# Patient Record
Sex: Female | Born: 1975 | Race: White | Hispanic: No | Marital: Single | Smoking: Never smoker
Health system: Southern US, Community
[De-identification: ages and names within clinical notes are randomized; demographics above are authoritative.]

## PROBLEM LIST (undated history)

## (undated) HISTORY — PX: NO PAST SURGERIES: SHX2092

---

## 1999-04-26 ENCOUNTER — Other Ambulatory Visit: Admission: RE | Admit: 1999-04-26 | Discharge: 1999-04-26 | Payer: Self-pay | Admitting: Family Medicine

## 2000-05-01 ENCOUNTER — Other Ambulatory Visit: Admission: RE | Admit: 2000-05-01 | Discharge: 2000-05-01 | Payer: Self-pay | Admitting: Family Medicine

## 2000-06-25 ENCOUNTER — Other Ambulatory Visit: Admission: RE | Admit: 2000-06-25 | Discharge: 2000-06-25 | Payer: Self-pay | Admitting: Family Medicine

## 2001-07-23 ENCOUNTER — Other Ambulatory Visit: Admission: RE | Admit: 2001-07-23 | Discharge: 2001-07-23 | Payer: Self-pay | Admitting: Family Medicine

## 2002-09-30 ENCOUNTER — Other Ambulatory Visit: Admission: RE | Admit: 2002-09-30 | Discharge: 2002-09-30 | Payer: Self-pay | Admitting: Family Medicine

## 2003-10-27 ENCOUNTER — Other Ambulatory Visit: Admission: RE | Admit: 2003-10-27 | Discharge: 2003-10-27 | Payer: Self-pay | Admitting: Family Medicine

## 2018-01-07 ENCOUNTER — Other Ambulatory Visit: Payer: Self-pay

## 2018-01-09 LAB — CYTOLOGY - PAP: DIAGNOSIS: NEGATIVE

## 2022-01-16 ENCOUNTER — Other Ambulatory Visit (HOSPITAL_COMMUNITY): Payer: Self-pay | Admitting: Obstetrics and Gynecology

## 2022-01-16 DIAGNOSIS — Z1231 Encounter for screening mammogram for malignant neoplasm of breast: Secondary | ICD-10-CM

## 2022-02-17 ENCOUNTER — Other Ambulatory Visit: Payer: Self-pay

## 2022-02-17 ENCOUNTER — Encounter (HOSPITAL_COMMUNITY): Payer: Self-pay

## 2022-02-17 ENCOUNTER — Inpatient Hospital Stay (HOSPITAL_COMMUNITY): Payer: Self-pay | Attending: Obstetrics and Gynecology | Admitting: *Deleted

## 2022-02-17 ENCOUNTER — Ambulatory Visit (HOSPITAL_COMMUNITY): Admission: RE | Admit: 2022-02-17 | Payer: Self-pay | Source: Ambulatory Visit

## 2022-02-17 VITALS — BP 121/86 | Wt 135.7 lb

## 2022-02-17 DIAGNOSIS — Z1211 Encounter for screening for malignant neoplasm of colon: Secondary | ICD-10-CM

## 2022-02-17 DIAGNOSIS — Z01419 Encounter for gynecological examination (general) (routine) without abnormal findings: Secondary | ICD-10-CM

## 2022-02-17 DIAGNOSIS — R2231 Localized swelling, mass and lump, right upper limb: Secondary | ICD-10-CM

## 2022-02-17 NOTE — Patient Instructions (Signed)
Explained breast self awareness with Lindsay Peters. Pap smear completed today.  Let her know BCCCP will cover Pap smears and HPV typing every 5 years unless has a history of abnormal Pap smears. Referred patient to [facility] for a diagnostic mammogram. [name] will call patient to schedule. Patient aware someone will call her to schedule appointment. Let patient know will follow up with her within the next couple weeks with results of Pap smear by phone. [name] verbalized understanding. ? ?Kaeo Jacome, Kathaleen Maser, RN ?11:37 AM ? ? ? ? ?

## 2022-02-17 NOTE — Progress Notes (Addendum)
[name] is a 46 y.o. No obstetric history on file female who presents to [facility] clinic today with complaint of right axillary lump x 2-3 months.  ?  ?Pap Smear: Pap smear completed today. Last Pap smear was 01/07/2018 at the free cervical cancer screening clinic and was normal. Per patient has no history of an abnormal Pap smear. Last Pap smear result is available in Epic. ?  ?Physical exam: ?Breasts ?Left breast larger than right breast that per patient is normal for her. No skin abnormalities bilateral breasts. Razor burn observed right axilla. No nipple retraction bilateral breasts. No nipple discharge bilateral breasts. No lymphadenopathy. No lumps palpated left breast. Palpated a lump within the right axilla at 11 o'clock 12 cm from the nipple. No complaints of pain or tenderness on exam. ?  ?Pelvic/Bimanual ?Ext Genitalia ?Razor burn observed external genitalia. Patient stated she shaved last night. No swelling and no discharge observed on external genitalia.      ?  ?Vagina ?Vagina pink and normal texture. No lesions or discharge observed in vagina.      ?  ?Cervix ?Cervix is present. Cervix pink and of normal texture. No discharge observed.  ?  ?Uterus ?Uterus is present and palpable. Uterus in normal position and normal size.      ?  ?Adnexae ?Bilateral ovaries present and palpable. No tenderness on palpation.       ?  ?Rectovaginal ?No rectal exam completed today since patient had no rectal complaints. No skin abnormalities observed on exam.   ?  ?Smoking History: ?Patient has never smoked. ?  ?Patient Navigation: ?Patient education provided. Access to services provided for patient through [facility] program.  ? ?Colorectal Cancer Screening: ?Per patient has never had colonoscopy completed. FIT Test given to patient to complete. No complaints today.  ?  ?Breast and Cervical Cancer Risk Assessment: ?Patient does not have family history of breast cancer, known genetic mutations, or radiation treatment to  the chest before age 68. Patient does not have history of cervical dysplasia, immunocompromised, or DES exposure in-utero. ? ?Risk Assessment   ? ? Risk Scores   ? ?   02/17/2022  ? Last edited by: Narda Rutherford, LPN  ? 5-year risk: 0.9 %  ? Lifetime risk: 9.5 %  ? ?  ?  ? ?  ? ? ?A: ?BCCCP exam with pap smear ?Complaint of right axillary lump. ? ?P: ?Referred patient to [facility] for a diagnostic mammogram. [name] will call patient to schedule. ? ?Priscille Heidelberg, RN ?02/17/2022 11:37 AM   ?

## 2022-02-20 ENCOUNTER — Other Ambulatory Visit (HOSPITAL_COMMUNITY): Payer: Self-pay | Admitting: Obstetrics and Gynecology

## 2022-02-20 DIAGNOSIS — R2231 Localized swelling, mass and lump, right upper limb: Secondary | ICD-10-CM

## 2022-02-20 LAB — CYTOLOGY - PAP
Comment: NEGATIVE
Diagnosis: NEGATIVE
High risk HPV: NEGATIVE

## 2022-02-22 ENCOUNTER — Telehealth: Payer: Self-pay

## 2022-02-22 NOTE — Telephone Encounter (Signed)
Patient informed negative Pap/HPV results, next pap smear is due in 5 years. Patient verbalized understanding.  ?

## 2022-02-23 LAB — FECAL OCCULT BLOOD, IMMUNOCHEMICAL: Fecal Occult Bld: NEGATIVE

## 2022-02-28 ENCOUNTER — Telehealth: Payer: Self-pay

## 2022-02-28 NOTE — Telephone Encounter (Signed)
Patient informed negative FIT test results. Patient verbalized understanding.  

## 2022-03-07 ENCOUNTER — Ambulatory Visit (HOSPITAL_COMMUNITY)
Admission: RE | Admit: 2022-03-07 | Discharge: 2022-03-07 | Disposition: A | Discharge status: Home / Self Care | Payer: Self-pay | Source: Ambulatory Visit | Attending: Obstetrics and Gynecology | Admitting: Obstetrics and Gynecology

## 2022-03-07 DIAGNOSIS — R2231 Localized swelling, mass and lump, right upper limb: Secondary | ICD-10-CM

## 2023-02-05 ENCOUNTER — Other Ambulatory Visit (HOSPITAL_COMMUNITY): Payer: Self-pay | Admitting: Obstetrics and Gynecology

## 2023-02-05 DIAGNOSIS — Z139 Encounter for screening, unspecified: Secondary | ICD-10-CM

## 2023-02-23 ENCOUNTER — Inpatient Hospital Stay: Payer: Self-pay

## 2023-02-23 ENCOUNTER — Ambulatory Visit (HOSPITAL_COMMUNITY): Payer: Self-pay

## 2023-03-30 ENCOUNTER — Other Ambulatory Visit: Payer: Self-pay

## 2023-03-30 ENCOUNTER — Ambulatory Visit (HOSPITAL_COMMUNITY)
Admission: RE | Admit: 2023-03-30 | Discharge: 2023-03-30 | Disposition: A | Discharge status: Home / Self Care | Payer: Self-pay | Source: Ambulatory Visit | Attending: Obstetrics and Gynecology | Admitting: Obstetrics and Gynecology

## 2023-03-30 ENCOUNTER — Inpatient Hospital Stay: Payer: Self-pay | Admitting: Hematology and Oncology

## 2023-03-30 VITALS — BP 112/71 | Wt 141.6 lb

## 2023-03-30 DIAGNOSIS — Z1231 Encounter for screening mammogram for malignant neoplasm of breast: Secondary | ICD-10-CM

## 2023-03-30 DIAGNOSIS — Z139 Encounter for screening, unspecified: Secondary | ICD-10-CM | POA: Insufficient documentation

## 2023-03-30 DIAGNOSIS — Z1211 Encounter for screening for malignant neoplasm of colon: Secondary | ICD-10-CM

## 2023-03-30 NOTE — Patient Instructions (Signed)
Taught Lindsay Peters about self breast awareness and gave educational materials to take home. Patient did not need a Pap smear today due to last Pap smear was in 02/17/22 per patient.  Let her know BCCCP will cover Pap smears every 5 years unless has a history of abnormal Pap smears. Referred patient to [facility] for screening mammogram. Appointment scheduled for 03/30/23. Patient aware of appointment and will be there. Let patient know will follow up with her within the next couple weeks with results. Lindsay Peters verbalized understanding.  Pascal Lux, NP 9:37 AM

## 2023-03-30 NOTE — Progress Notes (Signed)
[name] is a 47 y.o. female who presents to [facility] clinic today with complaint of area under right axillary.    Pap Smear: Pap not smear completed today. Last Pap smear was 02/17/2022 negative with negative HPV at [facility]. Per patient has no history of an abnormal Pap smear. Last Pap smear result is available in Epic.   Physical exam: Breasts Breasts symmetrical. No skin abnormalities bilateral breasts. No nipple retraction bilateral breasts. No nipple discharge bilateral breasts. No lymphadenopathy. No lumps palpated bilateral breasts.       Pelvic/Bimanual Pap is not indicated today    Smoking History: Patient has never smoked Not referred to quit line.    Patient Navigation: Patient education provided. Access to services provided for patient through [facility] program. No interpreter provided. No transportation provided   Colorectal Cancer Screening: Per patient has never had colonoscopy completed. FIT colorectal screen completed last year negative. Patient given FIT test to complete and mail. No complaints today.    Breast and Cervical Cancer Risk Assessment: Patient does not have family history of breast cancer, known genetic mutations, or radiation treatment to the chest before age 26. Patient does not have history of cervical dysplasia, immunocompromised, or DES exposure in-utero.  Risk Assessment   No risk assessment data for the current encounter  Risk Scores       02/17/2022   Last edited by: Narda Rutherford, LPN   5-year risk: 0.9 %   Lifetime risk: 9.5 %            A: BCCCP exam without pap smear No complaints of breast lumps.  P: [facility] to [facility] for a screening mammogram. Appointment scheduled 03/30/2023 at 1245 pm.  Joette Catching, RN 03/30/2023 9:55 AM

## 2023-04-03 ENCOUNTER — Other Ambulatory Visit (HOSPITAL_COMMUNITY): Payer: Self-pay | Admitting: Obstetrics and Gynecology

## 2023-04-03 ENCOUNTER — Telehealth: Payer: Self-pay

## 2023-04-03 DIAGNOSIS — R928 Other abnormal and inconclusive findings on diagnostic imaging of breast: Secondary | ICD-10-CM

## 2023-04-03 NOTE — Telephone Encounter (Signed)
Patient called and stated that someone had called her mother's phone number instead of hers from [facility], and told her she needed to call back. Patient informed she needs to contact [facility], in the left breast more imaging is needed, covered by BCCCP. Patient was given [facility] contact information.

## 2023-04-07 LAB — FECAL OCCULT BLOOD, IMMUNOCHEMICAL: Fecal Occult Bld: NEGATIVE

## 2023-04-11 ENCOUNTER — Telehealth: Payer: Self-pay

## 2023-04-11 NOTE — Telephone Encounter (Signed)
Patient informed negative FIT test results, verbalized understanding.  

## 2023-04-12 ENCOUNTER — Ambulatory Visit (HOSPITAL_COMMUNITY)
Admission: RE | Admit: 2023-04-12 | Discharge: 2023-04-12 | Disposition: A | Discharge status: Home / Self Care | Payer: Self-pay | Source: Ambulatory Visit | Attending: Obstetrics and Gynecology | Admitting: Obstetrics and Gynecology

## 2023-04-12 ENCOUNTER — Other Ambulatory Visit (HOSPITAL_COMMUNITY): Payer: Self-pay | Admitting: Obstetrics and Gynecology

## 2023-04-12 DIAGNOSIS — R928 Other abnormal and inconclusive findings on diagnostic imaging of breast: Secondary | ICD-10-CM

## 2023-04-16 ENCOUNTER — Other Ambulatory Visit (HOSPITAL_COMMUNITY): Payer: Self-pay | Admitting: Obstetrics and Gynecology

## 2023-04-16 DIAGNOSIS — R928 Other abnormal and inconclusive findings on diagnostic imaging of breast: Secondary | ICD-10-CM

## 2023-04-17 ENCOUNTER — Ambulatory Visit (HOSPITAL_COMMUNITY)
Admission: RE | Admit: 2023-04-17 | Discharge: 2023-04-17 | Disposition: A | Discharge status: Home / Self Care | Payer: Medicaid Other | Source: Ambulatory Visit | Attending: Obstetrics and Gynecology | Admitting: Obstetrics and Gynecology

## 2023-04-17 ENCOUNTER — Encounter (HOSPITAL_COMMUNITY): Payer: Self-pay

## 2023-04-17 DIAGNOSIS — R928 Other abnormal and inconclusive findings on diagnostic imaging of breast: Secondary | ICD-10-CM

## 2023-04-17 DIAGNOSIS — Z0189 Encounter for other specified special examinations: Secondary | ICD-10-CM | POA: Diagnosis not present

## 2023-04-17 HISTORY — PX: BREAST BIOPSY: SHX20

## 2023-04-17 MED ORDER — LIDOCAINE-EPINEPHRINE 1 %-1:200000 IJ SOLN
10.0000 mL | Freq: Once | INTRAMUSCULAR | Status: AC
Start: 2023-04-17 — End: 2023-04-17
  Administered 2023-04-17: 10 mL via INTRADERMAL

## 2023-04-17 MED ORDER — LIDOCAINE HCL (PF) 2 % IJ SOLN
10.0000 mL | Freq: Once | INTRAMUSCULAR | Status: AC
  Administered 2023-04-17: 10 mL

## 2023-04-17 MED ORDER — LIDOCAINE HCL (PF) 2 % IJ SOLN
INTRAMUSCULAR | Status: AC
  Filled 2023-04-17: qty 10

## 2023-04-17 NOTE — Progress Notes (Signed)
PT tolerated left breast biopsy well today with NAD noted. PT verbalized understanding of discharge instructions. PT ambulated back to the mammogram area this time and given ice packs. 

## 2023-04-18 NOTE — Progress Notes (Signed)
Introductory phone call placed to patient today. Introduced myself and explained my role in the patient's care. Scheduled patient's new patient appointment. Patient made aware of clinic's location and visitor policy. No further questions at this time per patient.

## 2023-04-20 LAB — SURGICAL PATHOLOGY

## 2023-05-01 NOTE — Progress Notes (Signed)
[facility] [address] [place] [address]   Clinic Day:  05/01/2023  Referring physician: No ref. provider found  Patient Care Team: Pcp, No as PCP - General   ASSESSMENT & PLAN:   Assessment: ***  Plan: ***  No orders of the defined types were placed in this encounter.     Lindsay Peters,acting as a Neurosurgeon for Lindsay Massed, MD.,have documented all relevant documentation on the behalf of Lindsay Massed, MD,as directed by  Lindsay Massed, MD while in the presence of Lindsay Massed, MD.   ***  Lindsay Peters   7/16/20249:09 PM  CHIEF COMPLAINT/PURPOSE OF CONSULT:   Diagnosis: left breast cancer  Cancer Staging  No matching staging information was found for the patient.    Prior Therapy: none  Current Therapy:  none   HISTORY OF PRESENT ILLNESS:   Oncology History   No history exists.      Lindsay Peters is a 47 y.o. female presenting to clinic today for evaluation of left breast cancer from a recent mammogram.  Patient underwent a unilateral left mammogram on 6/27, after a bilateral mammogram on 6/14 that found asymmetry on the left breast, which found indeterminate 0.5 cm mass in the left breast at the 3 o'clock position. She had an US of the left breast on 6/27 which found the same as above. 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   Today, she states that she is doing well overall. Her appetite level is at ***%. Her energy level is at ***%.  PAST MEDICAL HISTORY:   Past Medical History: No past medical history on file.  Surgical History: Past Surgical History:  Procedure Laterality Date   BREAST BIOPSY Left 04/17/2023   [place] LT BREAST BX W LOC DEV 1ST LESION IMG BX SPEC US GUIDE 04/17/2023 Lindsay Cap, MD [facility]-ULTRASOUND    Social History: Social History   Socioeconomic History    Marital status: Single    Spouse name: Not on file   Number of children: 0   Years of education: Not on file   Highest education level: High school graduate  Occupational History   Not on file  Tobacco Use   Smoking status: Never   Smokeless tobacco: Never  Vaping Use   Vaping status: Never Used  Substance and Sexual Activity   Alcohol use: Never   Drug use: Never   Sexual activity: Not Currently  Other Topics Concern   Not on file  Social History Narrative   Not on file   Social Determinants of Health   Financial Resource Strain: Not on file  Food Insecurity: No Food Insecurity (03/30/2023)   Hunger Vital Sign    Worried About Running Out of Food in the Last Year: Never true    Ran Out of Food in the Last Year: Never true  Transportation Needs: No Transportation Needs (03/30/2023)   PRAPARE - Administrator, Civil Service (Medical): No    Lack of Transportation (Non-Medical): No  Physical Activity: Not on file  Stress: Not on file  Social Connections: Not on file  Intimate Partner Violence: Not on file    Family History: Family History  Problem Relation Age of Onset   Diverticulosis Mother    Lung cancer Father    Diabetes Maternal Grandmother     Current Medications:  Current Outpatient Medications:  Multiple Vitamin (MULTIVITAMIN) capsule, Take 1 capsule by mouth daily., Disp: , Rfl:    Allergies: Allergies  Allergen Reactions   Sulfa Antibiotics Rash    REVIEW OF SYSTEMS:   Review of Systems  Constitutional:  Negative for chills, fatigue and fever.  HENT:   Negative for lump/mass, mouth sores, nosebleeds, sore throat and trouble swallowing.   Eyes:  Negative for eye problems.  Respiratory:  Negative for cough and shortness of breath.   Cardiovascular:  Negative for chest pain, leg swelling and palpitations.  Gastrointestinal:  Negative for abdominal pain, constipation, diarrhea, nausea and vomiting.  Genitourinary:  Negative for  bladder incontinence, difficulty urinating, dysuria, frequency, hematuria and nocturia.   Musculoskeletal:  Negative for arthralgias, back pain, flank pain, myalgias and neck pain.  Skin:  Negative for itching and rash.  Neurological:  Negative for dizziness, headaches and numbness.  Hematological:  Does not bruise/bleed easily.  Psychiatric/Behavioral:  Negative for depression, sleep disturbance and suicidal ideas. The patient is not nervous/anxious.   All other systems reviewed and are negative.    VITALS:   There were no vitals taken for this visit.  Wt Readings from Last 3 Encounters:  03/30/23 141 lb 9.6 oz (64.2 kg)  02/17/22 135 lb 11.2 oz (61.6 kg)    There is no height or weight on file to calculate BMI.  Performance status (ECOG): {CHL ONC [id number]  PHYSICAL EXAM:   Physical Exam Vitals and nursing note reviewed. Exam conducted with a chaperone present.  Constitutional:      Appearance: Normal appearance.  Cardiovascular:     Rate and Rhythm: Normal rate and regular rhythm.     Pulses: Normal pulses.     Heart sounds: Normal heart sounds.  Pulmonary:     Effort: Pulmonary effort is normal.     Breath sounds: Normal breath sounds.  Abdominal:     Palpations: Abdomen is soft. There is no hepatomegaly, splenomegaly or mass.     Tenderness: There is no abdominal tenderness.  Musculoskeletal:     Right lower leg: No edema.     Left lower leg: No edema.  Lymphadenopathy:     Cervical: No cervical adenopathy.     Right cervical: No superficial, deep or posterior cervical adenopathy.    Left cervical: No superficial, deep or posterior cervical adenopathy.     Upper Body:     Right upper body: No supraclavicular or axillary adenopathy.     Left upper body: No supraclavicular or axillary adenopathy.  Neurological:     General: No focal deficit present.     Mental Status: She is alert and oriented to person, place, and time.  Psychiatric:        Mood and Affect:  Mood normal.        Behavior: Behavior normal.     LABS:       No data to display             No data to display           No results found for: "CEA1", "CEA" / No results found for: "CEA1", "CEA" No results found for: "PSA1" No results found for: "ZOX096" No results found for: "CAN125"  No results found for: "TOTALPROTELP", "ALBUMINELP", "A1GS", "A2GS", "BETS", "BETA2SER", "GAMS", "MSPIKE", "SPEI" No results found for: "TIBC", "FERRITIN", "IRONPCTSAT" No results found for: "LDH"   STUDIES:   [place] LT BREAST BX W LOC DEV 1ST LESION IMG BX SPEC US GUIDE  Addendum Date: 04/24/2023  ADDENDUM REPORT: 04/24/2023 07:30 ADDENDUM: PATHOLOGY revealed: A. 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 Overall grade: 1. Negative for angiolymphatic invasion. Negative for microcalcifications. Tumor measures 7 mm in greatest linear extent. Pathology results are CONCORDANT with imaging findings, per Dr. Edwin Peters. Pathology results and recommendations were discussed with patient via telephone on 04/18/2023. Patient reported biopsy site doing well with no adverse symptoms, and only slight tenderness at the site. Post biopsy care instructions were reviewed, questions were answered and my direct phone number was provided. Patient was instructed to call [facility] for any additional questions or concerns related to biopsy site. RECOMMENDATION: Surgical and oncological consultation. Request for surgical and oncological consultation was relayed to Ellin Mayhew RT at [facility] by Randa Lynn RN on 04/18/2023. Pathology results reported by Randa Lynn RN on 04/23/2023. Electronically Signed   By: Lindsay Peters M.D.   On: 04/24/2023 07:30   Result Date: 04/24/2023 CLINICAL DATA:  Patient presents for ultrasound-guided core biopsy of a 0.5 cm mass in the left  breast at the 3 o'clock position. EXAM: ULTRASOUND GUIDED LEFT BREAST CORE NEEDLE BIOPSY COMPARISON:  Previous exam(s). PROCEDURE: I met with the patient and we discussed the procedure of ultrasound-guided biopsy, including benefits and alternatives. We discussed the high likelihood of a successful procedure. We discussed the risks of the procedure, including infection, bleeding, tissue injury, clip migration, and inadequate sampling. Informed written consent was given. The usual time-out protocol was performed immediately prior to the procedure. Lesion quadrant: Upper outer Using sterile technique and 1% Lidocaine as local anesthetic, under direct ultrasound visualization, a 14 gauge spring-loaded device was used to perform biopsy of the mass in the left breast at the 3 o'clock position using a lateral to medial approach. At the conclusion of the procedure a ribbon shaped tissue marker clip was deployed into the biopsy cavity. Follow up 2 view mammogram was performed and dictated separately. IMPRESSION: Ultrasound guided biopsy of the mass in the left breast at the 3 o'clock position. No apparent complications. Electronically Signed: By: Lindsay Peters M.D. On: 04/17/2023 13:27   MM CLIP PLACEMENT LEFT  Result Date: 04/17/2023 CLINICAL DATA:  Post ultrasound-guided biopsy of a 0.5 cm mass in the left breast at 3 o'clock position. EXAM: 3D DIAGNOSTIC LEFT MAMMOGRAM POST ULTRASOUND BIOPSY COMPARISON:  Previous exam(s). FINDINGS: 3D Mammographic images were obtained following ultrasound-guided core biopsy of a mass in the left breast at the 3 o'clock position. A ribbon shaped biopsy marking clip is present at the site of the biopsied mass in the left breast at the 3 o'clock position. IMPRESSION: Ribbon shaped biopsy marking clip at site of biopsied mass in the left breast at the 3 o'clock position. Final Assessment: Post Procedure Mammograms for Marker Placement Electronically Signed   By: Lindsay Peters M.D.    On: 04/17/2023 13:32  MS 3D DIAG MAMMO UNI LT BR (aka MM)  Result Date: 04/12/2023 CLINICAL DATA:  Screening recall for possible left breast asymmetry. EXAM: DIGITAL DIAGNOSTIC UNILATERAL LEFT MAMMOGRAM WITH TOMOSYNTHESIS AND CAD; ULTRASOUND LEFT BREAST LIMITED TECHNIQUE: Left digital diagnostic mammography and breast tomosynthesis was performed. The images were evaluated with computer-aided detection. ; Targeted ultrasound examination of the left breast was performed. COMPARISON:  Previous exam(s). ACR Breast Density Category d: The breasts are extremely dense, which lowers the sensitivity of mammography. FINDINGS: Additional tomograms were performed of the left breast. There is  an oval mass in the outer left breast measuring 0.5 cm. Targeted ultrasound of the left breast was performed. There is an oval hypoechoic mass with margin irregularity in the left breast at 3 o'clock 3 cm from nipple measuring 0.5 x 0.3 x 0.4 cm. This corresponds well with the mass seen in the outer left breast at mammography. No lymphadenopathy seen in the left axilla. IMPRESSION: Indeterminate 0.5 cm mass in the left breast at the 3 o'clock position. RECOMMENDATION: Recommend ultrasound-guided core biopsy of the mass in the left breast at the 3 o'clock position. I have discussed the findings and recommendations with the patient. If applicable, a reminder letter will be sent to the patient regarding the next appointment. BI-RADS CATEGORY  4: Suspicious. Electronically Signed   By: Lindsay Peters M.D.   On: 04/12/2023 11:34  [place] LIMITED ULTRASOUND INCLUDING AXILLA LEFT BREAST   Result Date: 04/12/2023 CLINICAL DATA:  Screening recall for possible left breast asymmetry. EXAM: DIGITAL DIAGNOSTIC UNILATERAL LEFT MAMMOGRAM WITH TOMOSYNTHESIS AND CAD; ULTRASOUND LEFT BREAST LIMITED TECHNIQUE: Left digital diagnostic mammography and breast tomosynthesis was performed. The images were evaluated with computer-aided detection. ; Targeted  ultrasound examination of the left breast was performed. COMPARISON:  Previous exam(s). ACR Breast Density Category d: The breasts are extremely dense, which lowers the sensitivity of mammography. FINDINGS: Additional tomograms were performed of the left breast. There is an oval mass in the outer left breast measuring 0.5 cm. Targeted ultrasound of the left breast was performed. There is an oval hypoechoic mass with margin irregularity in the left breast at 3 o'clock 3 cm from nipple measuring 0.5 x 0.3 x 0.4 cm. This corresponds well with the mass seen in the outer left breast at mammography. No lymphadenopathy seen in the left axilla. IMPRESSION: Indeterminate 0.5 cm mass in the left breast at the 3 o'clock position. RECOMMENDATION: Recommend ultrasound-guided core biopsy of the mass in the left breast at the 3 o'clock position. I have discussed the findings and recommendations with the patient. If applicable, a reminder letter will be sent to the patient regarding the next appointment. BI-RADS CATEGORY  4: Suspicious. Electronically Signed   By: Lindsay Peters M.D.   On: 04/12/2023 11:34

## 2023-05-02 ENCOUNTER — Inpatient Hospital Stay: Payer: Medicaid Other | Attending: Hematology | Admitting: Hematology

## 2023-05-02 ENCOUNTER — Telehealth: Payer: Self-pay | Admitting: Licensed Clinical Social Worker

## 2023-05-02 ENCOUNTER — Encounter: Payer: Self-pay | Admitting: Hematology

## 2023-05-02 ENCOUNTER — Inpatient Hospital Stay: Payer: Medicaid Other

## 2023-05-02 VITALS — BP 116/65 | HR 77 | Temp 98.6°F | Resp 18 | Ht 64.37 in | Wt 141.3 lb

## 2023-05-02 DIAGNOSIS — Z8262 Family history of osteoporosis: Secondary | ICD-10-CM | POA: Diagnosis not present

## 2023-05-02 DIAGNOSIS — Z808 Family history of malignant neoplasm of other organs or systems: Secondary | ICD-10-CM | POA: Diagnosis not present

## 2023-05-02 DIAGNOSIS — Z801 Family history of malignant neoplasm of trachea, bronchus and lung: Secondary | ICD-10-CM | POA: Insufficient documentation

## 2023-05-02 DIAGNOSIS — C50812 Malignant neoplasm of overlapping sites of left female breast: Secondary | ICD-10-CM

## 2023-05-02 DIAGNOSIS — Z800 Family history of malignant neoplasm of digestive organs: Secondary | ICD-10-CM | POA: Insufficient documentation

## 2023-05-02 DIAGNOSIS — Z170 Estrogen receptor positive status [ER+]: Secondary | ICD-10-CM

## 2023-05-02 DIAGNOSIS — C50912 Malignant neoplasm of unspecified site of left female breast: Secondary | ICD-10-CM | POA: Insufficient documentation

## 2023-05-02 DIAGNOSIS — C50412 Malignant neoplasm of upper-outer quadrant of left female breast: Secondary | ICD-10-CM | POA: Insufficient documentation

## 2023-05-02 LAB — CBC WITH DIFFERENTIAL/PLATELET
Abs Immature Granulocytes: 0.02 10*3/uL (ref 0.00–0.07)
Basophils Absolute: 0 10*3/uL (ref 0.0–0.1)
Basophils Relative: 0 %
Eosinophils Absolute: 0.1 10*3/uL (ref 0.0–0.5)
Eosinophils Relative: 1 %
HCT: 40.3 % (ref 36.0–46.0)
Hemoglobin: 14.1 g/dL (ref 12.0–15.0)
Immature Granulocytes: 0 %
Lymphocytes Relative: 35 %
Lymphs Abs: 3 10*3/uL (ref 0.7–4.0)
MCH: 33 pg (ref 26.0–34.0)
MCHC: 35 g/dL (ref 30.0–36.0)
MCV: 94.4 fL (ref 80.0–100.0)
Monocytes Absolute: 0.8 10*3/uL (ref 0.1–1.0)
Monocytes Relative: 9 %
Neutro Abs: 4.7 10*3/uL (ref 1.7–7.7)
Neutrophils Relative %: 55 %
Platelets: 191 10*3/uL (ref 150–400)
RBC: 4.27 MIL/uL (ref 3.87–5.11)
RDW: 12.5 % (ref 11.5–15.5)
WBC: 8.6 10*3/uL (ref 4.0–10.5)
nRBC: 0 % (ref 0.0–0.2)

## 2023-05-02 LAB — COMPREHENSIVE METABOLIC PANEL
ALT: 42 U/L (ref 0–44)
AST: 27 U/L (ref 15–41)
Albumin: 4.3 g/dL (ref 3.5–5.0)
Alkaline Phosphatase: 77 U/L (ref 38–126)
Anion gap: 9 (ref 5–15)
BUN: 14 mg/dL (ref 6–20)
CO2: 27 mmol/L (ref 22–32)
Calcium: 9.8 mg/dL (ref 8.9–10.3)
Chloride: 101 mmol/L (ref 98–111)
Creatinine, Ser: 1 mg/dL (ref 0.44–1.00)
GFR, Estimated: >60 mL/min (ref >60)
Glucose, Bld: 113 mg/dL — ABNORMAL HIGH (ref 70–99)
Potassium: 3.4 mmol/L — ABNORMAL LOW (ref 3.5–5.1)
Sodium: 137 mmol/L (ref 135–145)
Total Bilirubin: 0.5 mg/dL (ref 0.3–1.2)
Total Protein: 7.5 g/dL (ref 6.5–8.1)

## 2023-05-02 LAB — VITAMIN D 25 HYDROXY (VIT D DEFICIENCY, FRACTURES): Vit D, 25-Hydroxy: 50.62 ng/mL (ref 30–100)

## 2023-05-02 LAB — GENETIC SCREENING ORDER

## 2023-05-02 NOTE — Patient Instructions (Addendum)
[facility]  Discharge Instructions  You were seen and examined today by Dr. Ellin Saba. Dr. Ellin Saba is a medical oncologist, meaning that he specializes in the treatment of cancer diagnoses. Dr. Ellin Saba discussed your past medical history, family history of cancers, and the events that led to you being here today.  You were referred to Dr. Ellin Saba due to a new diagnosis of breast cancer. 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 This is very treatable.  Proceed with surgery. You are scheduled to see the surgeon, Dr. Lovell Sheehan, on July the 25th. He will arrange for surgery at that time.  Following surgery, we will send your tumor off for additional testing to ensure there is no need for chemotherapy administration. Likely, you will need to be on an anti-estrogen pill to be taken daily for at least 5 years as well as radiation to the impacted breast.  Dr. Ellin Saba has requested labs today, including genetic testing, and will also arrange for you to have a bone density scan done.  Genetic testing is a blood test that tests for any present mutations that place you at a higher risk for cancer development based on family genetics.  Dr. Ellin Saba will see you 4 weeks after surgery.  Thank you for choosing [facility] to provide your oncology and hematology care.   To afford each patient quality time with our provider, please arrive at least 15 minutes before your scheduled appointment time. You may need to reschedule your appointment if you arrive late (10 or more minutes). Arriving late affects you and other patients whose appointments are after yours.  Also, if you miss three or more appointments without notifying the office, you may be dismissed from the clinic at the provider's discretion.    Again, thank you for choosing [facility].  Our hope is that these requests will decrease the amount of  time that you wait before being seen by our physicians.   If you have a lab appointment with the Cancer Center - please note that after April 8th, all labs will be drawn in the cancer center.  You do not have to check in or register with the main entrance as you have in the past but will complete your check-in at the cancer center.            _____________________________________________________________  Should you have questions after your visit to [facility], please contact our office at [phone] and follow the prompts.  Our office hours are 8:00 a.m. to 4:30 p.m. Monday - Thursday and 8:00 a.m. to 2:30 p.m. Friday.  Please note that voicemails left after 4:00 p.m. may not be returned until the following business day.  We are closed weekends and all major holidays.  You do have access to a nurse 24-7, just call the main number to the clinic [phone] and do not press any options, hold on the line and a nurse will answer the phone.    For prescription refill requests, have your pharmacy contact our office and allow 72 hours.    Masks are no longer required in the cancer centers. If you would like for your care team to wear a mask while they are taking care of you, please let them know. You may have one support person who is at least 49 years old accompany you for your appointments.

## 2023-05-02 NOTE — Telephone Encounter (Signed)
[facility] Breast Cancer STAT + Multi-Cancer+RNA panel order placed today per message from [place].   Lacy Duverney, MS, [facility] Genetic Counselor [email or web address] Phone: [phone]

## 2023-05-10 ENCOUNTER — Encounter: Payer: Self-pay | Admitting: Licensed Clinical Social Worker

## 2023-05-10 ENCOUNTER — Ambulatory Visit (INDEPENDENT_AMBULATORY_CARE_PROVIDER_SITE_OTHER): Payer: Self-pay | Admitting: General Surgery

## 2023-05-10 ENCOUNTER — Encounter: Payer: Self-pay | Admitting: General Surgery

## 2023-05-10 ENCOUNTER — Other Ambulatory Visit (HOSPITAL_COMMUNITY): Payer: Self-pay | Admitting: General Surgery

## 2023-05-10 ENCOUNTER — Ambulatory Visit (HOSPITAL_COMMUNITY)
Admission: RE | Admit: 2023-05-10 | Discharge: 2023-05-10 | Disposition: A | Discharge status: Home / Self Care | Payer: Medicaid Other | Source: Ambulatory Visit | Attending: Hematology | Admitting: Hematology

## 2023-05-10 VITALS — BP 111/69 | HR 70 | Temp 98.3°F | Resp 14 | Ht 64.0 in | Wt 142.0 lb

## 2023-05-10 DIAGNOSIS — Z170 Estrogen receptor positive status [ER+]: Secondary | ICD-10-CM

## 2023-05-10 DIAGNOSIS — C50412 Malignant neoplasm of upper-outer quadrant of left female breast: Secondary | ICD-10-CM | POA: Insufficient documentation

## 2023-05-10 DIAGNOSIS — C50912 Malignant neoplasm of unspecified site of left female breast: Secondary | ICD-10-CM

## 2023-05-10 DIAGNOSIS — Z1379 Encounter for other screening for genetic and chromosomal anomalies: Secondary | ICD-10-CM | POA: Insufficient documentation

## 2023-05-10 NOTE — Progress Notes (Addendum)
Lindsay Peters; [id number]; [birth year]   HPI Patient is a 47 year old white female who was referred to my care by Dr. Ellin Saba of oncology for a newly diagnosed left breast cancer.  This was found on routine mammography.  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  ER/PR positive.  She denies any immediate family of breast cancer.  She does not feel the lump. History reviewed. No pertinent past medical history.  Past Surgical History:  Procedure Laterality Date   BREAST BIOPSY Left 04/17/2023   [place] LT BREAST BX W LOC DEV 1ST LESION IMG BX SPEC US GUIDE 04/17/2023 Edwin Cap, MD [facility]    Family History  Problem Relation Age of Onset   Diverticulosis Mother    Lung cancer Father    Melanoma Maternal Aunt        died in June 13, 1999   Diabetes Maternal Grandmother    Colon cancer Maternal Grandmother        died 06-13-2007    Current Outpatient Medications on File Prior to Visit  Medication Sig Dispense Refill   Ascorbic Acid (VITAMIN C) 1000 MG tablet Take 1,000 mg by mouth daily.     Calcium Carb-Cholecalciferol (CALCIUM + VITAMIN D3) 600-10 MG-MCG TABS Take by mouth daily. 600mg -     cyanocobalamin 2000 MCG tablet Take 2,000 mcg by mouth daily. Per Patient takes 2,561mcg     Multiple Vitamin (MULTIVITAMIN) capsule Take 1 capsule by mouth daily.     No current facility-administered medications on file prior to visit.    Allergies  Allergen Reactions   Sulfa Antibiotics Rash    Social History   Substance and Sexual Activity  Alcohol Use Never    Social History   Tobacco Use  Smoking Status Never  Smokeless Tobacco Never    Review of Systems  Constitutional: Negative.   HENT: Negative.    Eyes: Negative.   Respiratory: Negative.    Cardiovascular: Negative.   Gastrointestinal: Negative.   Genitourinary: Negative.   Musculoskeletal: Negative.   Skin: Negative.   Neurological: Negative.   Endo/Heme/Allergies:  Negative.   Psychiatric/Behavioral: Negative.      Objective   Vitals:   05/10/23 1113  BP: 111/69  Pulse: 70  Resp: 14  Temp: 98.3 F (36.8 C)  SpO2: 98%    Physical Exam Vitals reviewed. Exam conducted with a chaperone present.  Constitutional:      Appearance: Normal appearance. She is normal weight. She is not ill-appearing.  HENT:     Head: Normocephalic and atraumatic.  Cardiovascular:     Rate and Rhythm: Normal rate and regular rhythm.     Heart sounds: Normal heart sounds. No murmur heard.    No friction rub. No gallop.  Pulmonary:     Effort: Pulmonary effort is normal. No respiratory distress.     Breath sounds: Normal breath sounds. No stridor. No wheezing, rhonchi or rales.  Musculoskeletal:     Cervical back: Normal range of motion and neck supple.  Lymphadenopathy:     Cervical: No cervical adenopathy.  Skin:    General: Skin is warm and dry.  Neurological:     Mental Status: She is alert and oriented to person, place, and time.   Breast: No dominant mass, nipple discharge, or dimpling noted in either breast.  Both axilla is negative for palpable nodes.  Mammogram and pathology results reviewed Dr. Marice Potter note reviewed  Assessment  Invasive ductal carcinoma of left  breast Plan  The surgical management of this was explained to the patient fully.  She would like to proceed with a left partial mastectomy with sentinel lymph node biopsy as she wants to keep her breast.  She is scheduled for a left partial mastectomy after tag placement with sentinel lymph node biopsy on 05/28/2023.  The risks and benefits of the procedures including bleeding, infection, nerve injury, and the possibility of incomplete margins were fully explained to the patient, who gave informed consent.

## 2023-05-14 ENCOUNTER — Encounter: Payer: Self-pay | Admitting: Licensed Clinical Social Worker

## 2023-05-21 ENCOUNTER — Other Ambulatory Visit (HOSPITAL_COMMUNITY): Payer: Self-pay | Admitting: General Surgery

## 2023-05-21 DIAGNOSIS — C50912 Malignant neoplasm of unspecified site of left female breast: Secondary | ICD-10-CM

## 2023-05-21 NOTE — H&P (Signed)
Lindsay Peters; [id number]; [birth year]   HPI Patient is a 47 year old white female who was referred to my care by Dr. Ellin Saba of oncology for a newly diagnosed left breast cancer.  This was found on routine mammography.  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  ER/PR positive.  She denies any immediate family of breast cancer.  She does not feel the lump. History reviewed. No pertinent past medical history.  Past Surgical History:  Procedure Laterality Date   BREAST BIOPSY Left 04/17/2023   [place] LT BREAST BX W LOC DEV 1ST LESION IMG BX SPEC US GUIDE 04/17/2023 Edwin Cap, MD [facility]    Family History  Problem Relation Age of Onset   Diverticulosis Mother    Lung cancer Father    Melanoma Maternal Aunt        died in June 13, 1999   Diabetes Maternal Grandmother    Colon cancer Maternal Grandmother        died 06-13-2007    Current Outpatient Medications on File Prior to Visit  Medication Sig Dispense Refill   Ascorbic Acid (VITAMIN C) 1000 MG tablet Take 1,000 mg by mouth daily.     Calcium Carb-Cholecalciferol (CALCIUM + VITAMIN D3) 600-10 MG-MCG TABS Take by mouth daily. 600mg -     cyanocobalamin 2000 MCG tablet Take 2,000 mcg by mouth daily. Per Patient takes 2,561mcg     Multiple Vitamin (MULTIVITAMIN) capsule Take 1 capsule by mouth daily.     No current facility-administered medications on file prior to visit.    Allergies  Allergen Reactions   Sulfa Antibiotics Rash    Social History   Substance and Sexual Activity  Alcohol Use Never    Social History   Tobacco Use  Smoking Status Never  Smokeless Tobacco Never    Review of Systems  Constitutional: Negative.   HENT: Negative.    Eyes: Negative.   Respiratory: Negative.    Cardiovascular: Negative.   Gastrointestinal: Negative.   Genitourinary: Negative.   Musculoskeletal: Negative.   Skin: Negative.   Neurological: Negative.   Endo/Heme/Allergies:  Negative.   Psychiatric/Behavioral: Negative.      Objective   Vitals:   05/10/23 1113  BP: 111/69  Pulse: 70  Resp: 14  Temp: 98.3 F (36.8 C)  SpO2: 98%    Physical Exam Vitals reviewed. Exam conducted with a chaperone present.  Constitutional:      Appearance: Normal appearance. She is normal weight. She is not ill-appearing.  HENT:     Head: Normocephalic and atraumatic.  Cardiovascular:     Rate and Rhythm: Normal rate and regular rhythm.     Heart sounds: Normal heart sounds. No murmur heard.    No friction rub. No gallop.  Pulmonary:     Effort: Pulmonary effort is normal. No respiratory distress.     Breath sounds: Normal breath sounds. No stridor. No wheezing, rhonchi or rales.  Musculoskeletal:     Cervical back: Normal range of motion and neck supple.  Lymphadenopathy:     Cervical: No cervical adenopathy.  Skin:    General: Skin is warm and dry.  Neurological:     Mental Status: She is alert and oriented to person, place, and time.   Breast: No dominant mass, nipple discharge, or dimpling noted in either breast.  Both axilla is negative for palpable nodes.  Mammogram and pathology results reviewed Dr. Marice Potter note reviewed  Assessment  Invasive ductal carcinoma of left  breast Plan  The surgical management of this was explained to the patient fully.  She would like to proceed with a left partial mastectomy with sentinel lymph node biopsy as she wants to keep her breast.  She is scheduled for a left partial mastectomy after tag placement with sentinel lymph node biopsy on 05/28/2023.  The risks and benefits of the procedures including bleeding, infection, nerve injury, and the possibility of incomplete margins were fully explained to the patient, who gave informed consent.

## 2023-05-22 ENCOUNTER — Encounter (HOSPITAL_COMMUNITY): Payer: Self-pay

## 2023-05-22 ENCOUNTER — Ambulatory Visit (HOSPITAL_COMMUNITY)
Admission: RE | Admit: 2023-05-22 | Discharge: 2023-05-22 | Disposition: A | Discharge status: Home / Self Care | Payer: Medicaid Other | Source: Ambulatory Visit | Attending: General Surgery | Admitting: General Surgery

## 2023-05-22 ENCOUNTER — Other Ambulatory Visit (HOSPITAL_COMMUNITY): Payer: Self-pay | Admitting: General Surgery

## 2023-05-22 DIAGNOSIS — C50912 Malignant neoplasm of unspecified site of left female breast: Secondary | ICD-10-CM

## 2023-05-22 IMAGING — MG DIGITAL DIAGNOSTIC BILAT W/ TOMO W/ CAD
8 of 14 series · 8 of 40 positions shown · non-contrast
Comparison: None.

CLINICAL DATA: 45-year-old female with a palpable area of concern
in the right axilla.

EXAM:
DIGITAL DIAGNOSTIC BILATERAL MAMMOGRAM WITH TOMOSYNTHESIS AND CAD;
ULTRASOUND RIGHT BREAST LIMITED
TECHNIQUE: Bilateral digital diagnostic mammography and breast tomosynthesis
was performed. The images were evaluated with computer-aided
detection.; Targeted ultrasound examination of the right breast was
performed

[L MLO synth-2D (1 of 2)]
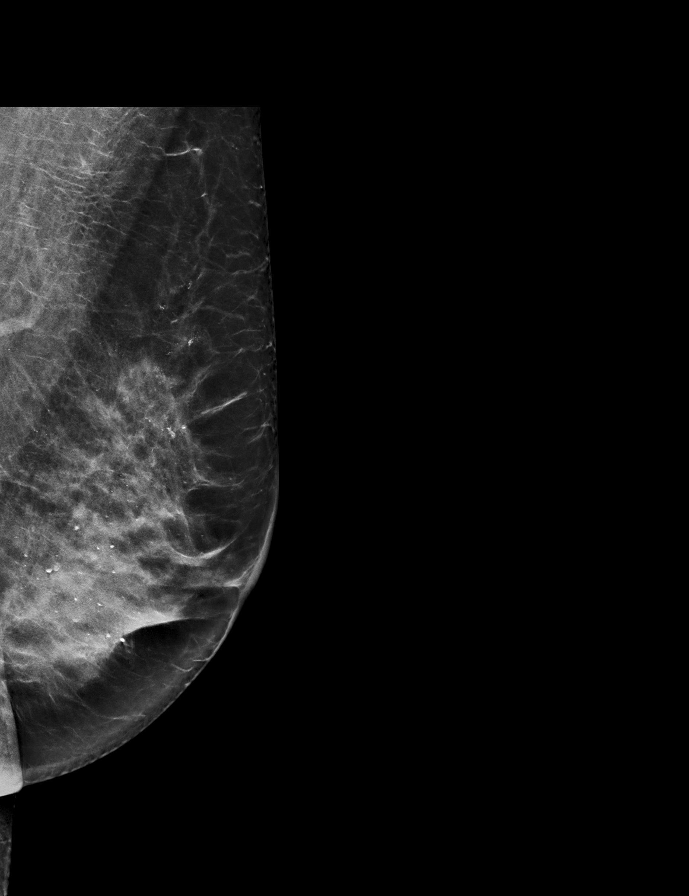

[R TAN synth-2D (1 of 2)]
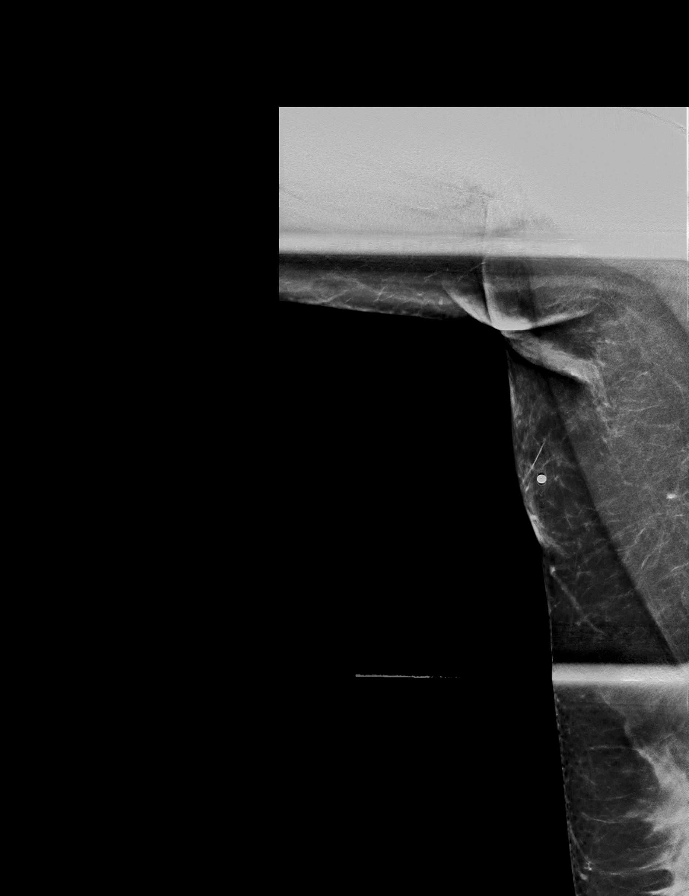

[R CC synth-2D]
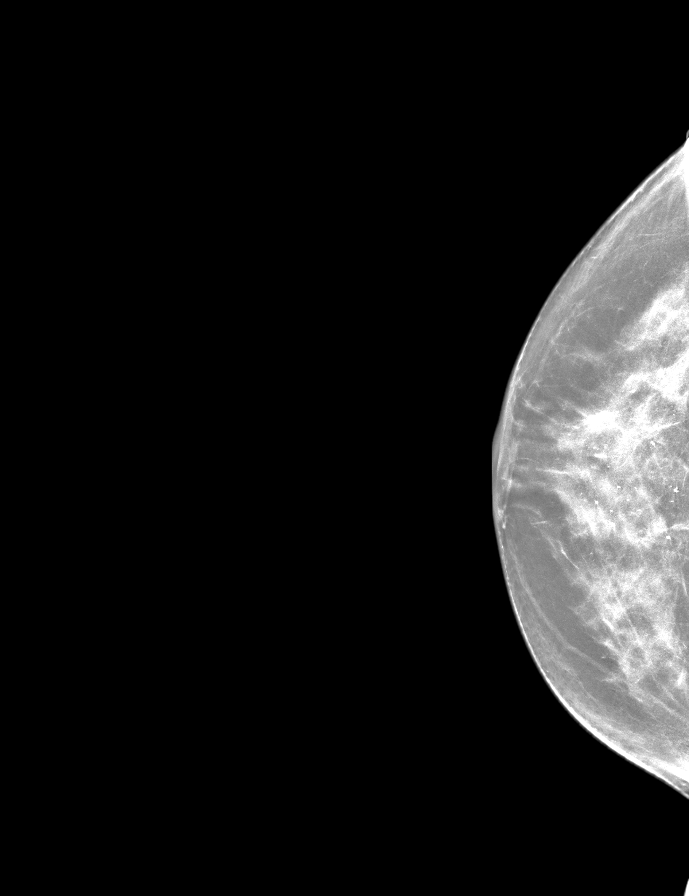

[L MLO synth-2D (2 of 2)]
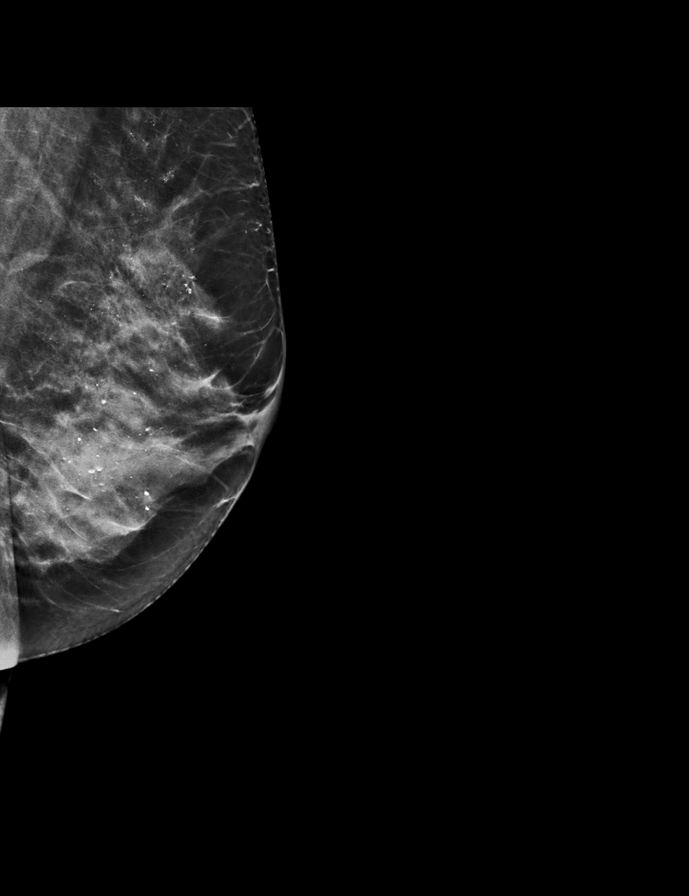

[R MLO synth-2D]
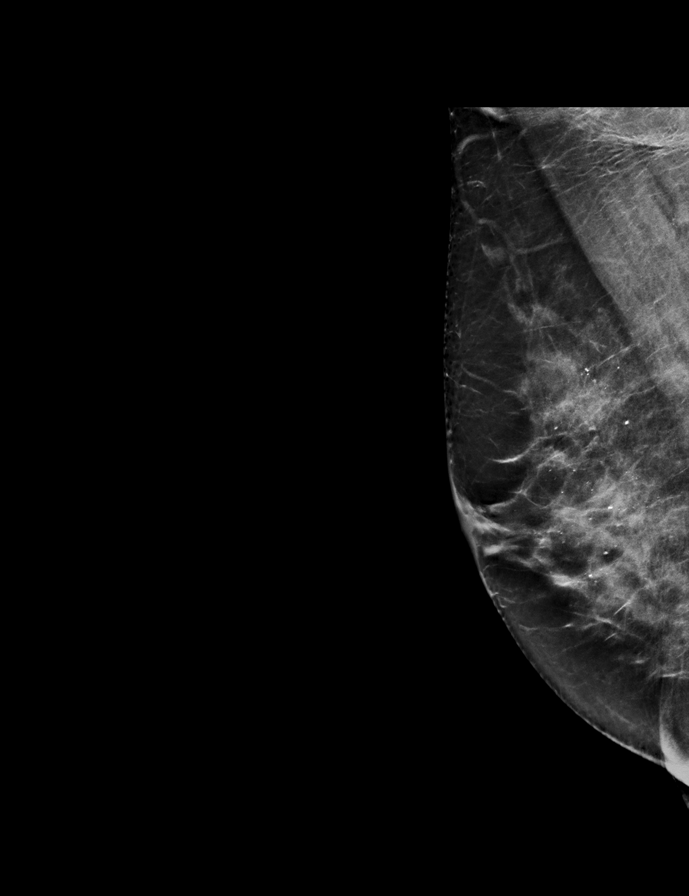

[R TAN synth-2D (2 of 2)]
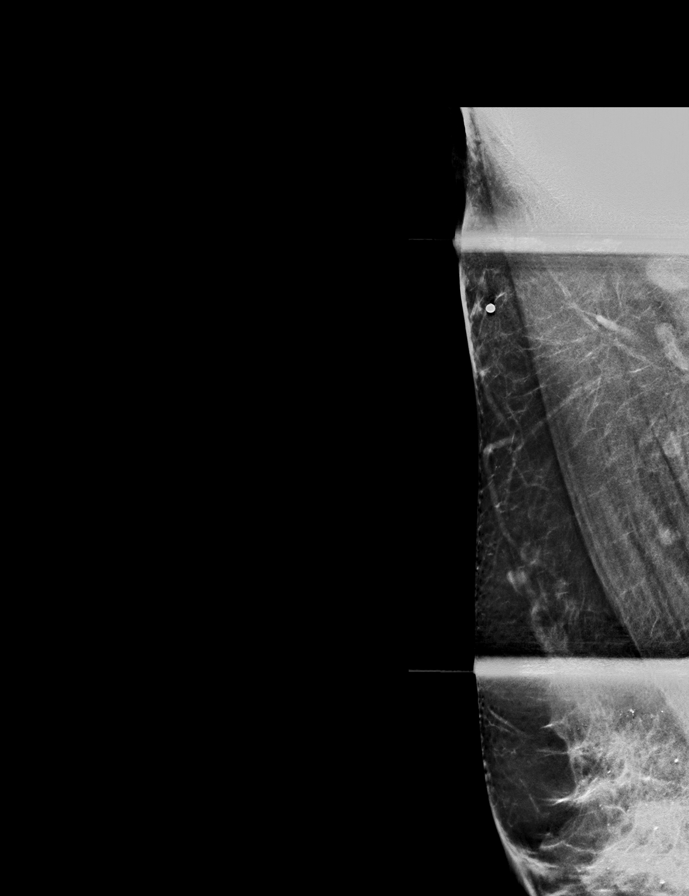

[L CC synth-2D]
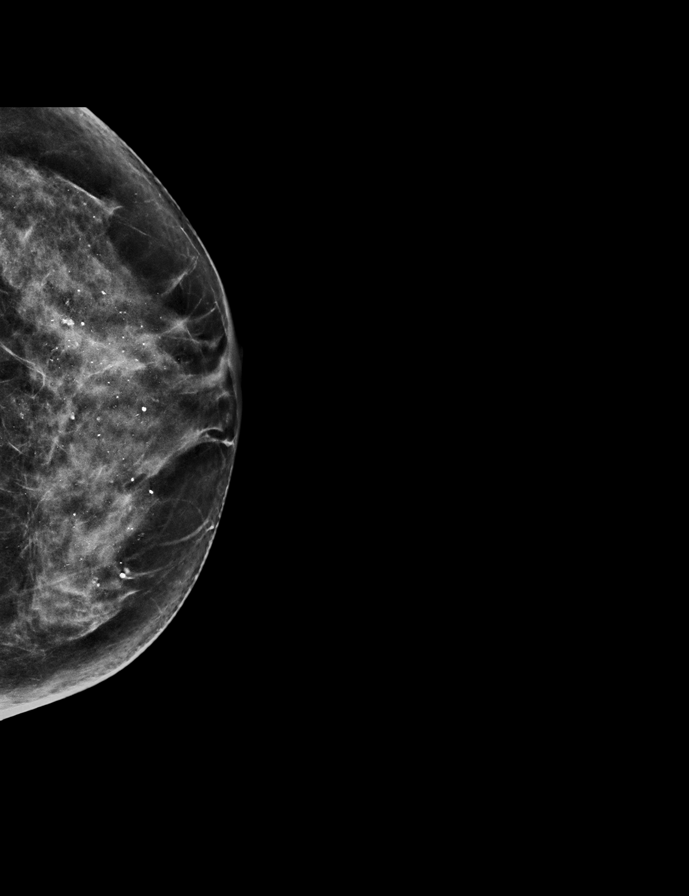

[L MLO tomo · tomo slice 36/71.0]
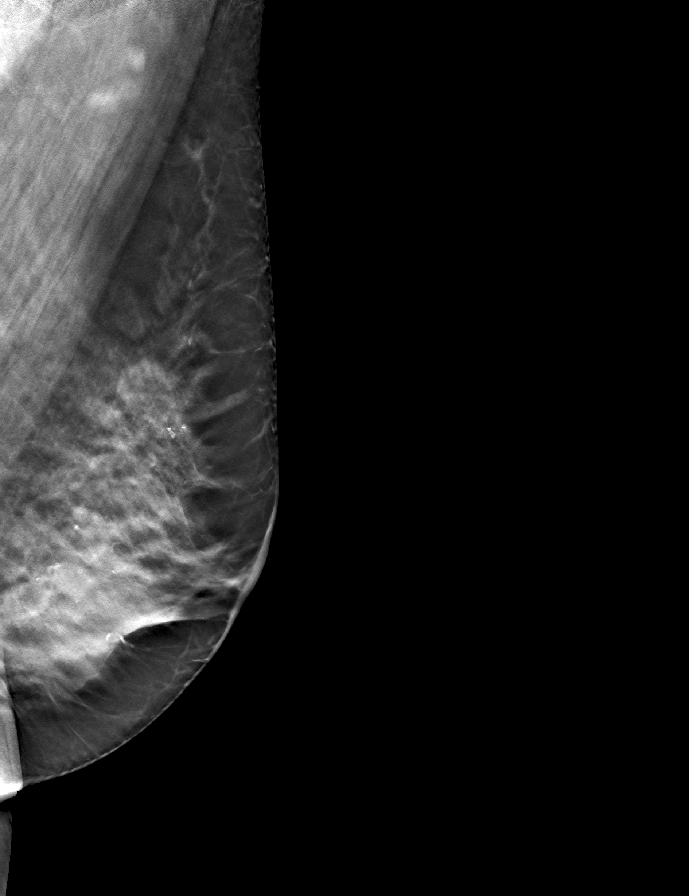

[8 of 40 positions shown; findings below may reference images not displayed]

ACR Breast Density Category c: The breast tissue is heterogeneously
dense, which may obscure small masses.
FINDINGS: No suspicious masses or calcifications are seen in either breast.
Spot compression tomograms were performed over the palpable area of
concern in the right axilla with no definite abnormality seen.

Physical examination at site of palpable concern in the right axilla
reveals soft fullness without a discrete underlying palpable mass.

Targeted ultrasound of the right axilla was performed. No suspicious
masses or abnormality seen, only normal-appearing fibrofatty tissue
identified. No morphologically abnormal lymph nodes seen.
IMPRESSION: 1. No mammographic or sonographic abnormalities at site of palpable
concern in the right axilla.

2.  No mammographic evidence of malignancy in either breast.

RECOMMENDATION:
1. Recommend further management of the palpable area of concern in
the right axilla be based on clinical assessment.

2.  Screening mammogram in one year.(Code:87-R-BEQ)

I have discussed the findings and recommendations with the patient.
If applicable, a reminder letter will be sent to the patient
regarding the next appointment.

BI-RADS CATEGORY  1: Negative.

## 2023-05-22 MED ORDER — LIDOCAINE HCL (PF) 2 % IJ SOLN
INTRAMUSCULAR | Status: AC
  Filled 2023-05-22: qty 10

## 2023-05-22 MED ORDER — LIDOCAINE-EPINEPHRINE (PF) 1 %-1:200000 IJ SOLN
INTRAMUSCULAR | Status: AC
  Filled 2023-05-22: qty 30

## 2023-05-22 NOTE — Patient Instructions (Signed)
CHANDA MINKUS  05/22/2023     @[email or web address] @   Your procedure is scheduled on  05/28/2023.   Report to [facility] at  0825  A.M.   Call this number if you have problems the morning of surgery:  [phone]  If you experience any cold or flu symptoms such as cough, fever, chills, shortness of breath, etc. between now and your scheduled surgery, please notify us at the above number.   Remember:  Do not eat or drink after midnight.      Take these medicines the morning of surgery with A SIP OF WATER                                               None.     Do not wear jewelry, make-up or nail polish, including gel polish,  artificial nails, or any other type of covering on natural nails (fingers and  toes).  Do not wear lotions, powders, or perfumes, or deodorant.  Do not shave 48 hours prior to surgery.  Men may shave face and neck.  Do not bring valuables to the hospital.  [facility] is not responsible for any belongings or valuables.  Contacts, dentures or bridgework may not be worn into surgery.  Leave your suitcase in the car.  After surgery it may be brought to your room.  For patients admitted to the hospital, discharge time will be determined by your treatment team.  Patients discharged the day of surgery will not be allowed to drive home and must have someone with them for 24 hours.    Special instructions:   DO NOT smoke tobacco or vape for 24 hours before your procedure.  Please read over the following fact sheets that you were given. Pain Booklet, Coughing and Deep Breathing, Surgical Site Infection Prevention, Anesthesia Post-op Instructions, and Care and Recovery After Surgery      Sentinel Lymph Node Biopsy in Breast Cancer Treatment, Care After The following information offers guidance on how to care for yourself after your procedure. Your health care provider may also give you more specific instructions. If you have problems or questions,  contact your health care provider. What can I expect after the procedure? After the procedure, it is common to have: Blue urine and darker stool for the next 24 hours. This is caused by the dye that was used during the procedure and is normal. Blue skin at the injection site. This may last for up to 8 weeks. Numbness, tingling, or pain near your incision. Swelling or bruising near your incision. Follow these instructions at home: Activity Avoid activities that take a lot of effort. Return to your normal activities as told by your health care provider. Ask your health care provider what activities are safe for you. Incision care  Follow instructions from your health care provider about how to take care of your incision. Make sure you: Wash your hands with soap and water for at least 20 seconds before and after you change your bandage (dressing). If soap and water are not available, use hand sanitizer. Change your dressing as told by your health care provider. Leave stitches (sutures), metal clips, skin glue, or adhesive strips in place. These skin closures may need to stay in place for 2 weeks or longer. If adhesive strip edges start to loosen  and curl up, you may trim the loose edges. Do not remove adhesive strips completely unless your health care provider tells you to do that. Do not take baths, swim, or use a hot tub until your health care provider approves. Ask your health care provider if you may take showers. You may be able to shower 24 hours after your procedure. Check your biopsy site every day for signs of infection. Check for: More redness, swelling, or pain. Fluid or blood. Warmth. Pus or a bad smell. General instructions If you were given a surgical bra, wear it for the next 48 hours. You may remove the bra to shower. Take over-the-counter and prescription medicines only as told by your health care provider. You may resume your regular diet. Do not have your blood pressure  taken or have blood drawn from the arm on the side of the biopsy until your health care provider says it is okay. You may need to be screened for extra fluid around the lymph nodes and swelling in the breast and arm (lymphedema). Follow instructions from your health care provider about how often you should be checked. Keep all follow-up visits. This is important. Contact a health care provider if you have: Nausea and vomiting. Any of these signs of infection: More redness, swelling, or pain around your biopsy site. Fluid or blood coming from your incision. Warmth coming from your incision area. Pus or a bad smell coming from your incision. Any new bruising. Chills or a fever. Get help right away if you have: Pain that is getting worse and your medicine is not helping. Vomiting that will not stop. Chest pain or trouble breathing. These symptoms may be an emergency. Get help right away. Call 911. Do not wait to see if the symptoms will go away. Do not drive yourself to the hospital. Summary After the procedure, it is common to have blue urine and darker stool for the next 24 hours. This is normal. It is caused by the dye that was used during the procedure. Follow instructions from your health care provider about how to take care of your incision. Do not have your blood pressure taken or have blood drawn from the arm on the side of the biopsy until your health care provider says it is okay. You may need to be screened for extra fluid around the lymph nodes and swelling in the breast and arm (lymphedema). Follow instructions from your health care provider about how often you should be checked. This information is not intended to replace advice given to you by your health care provider. Make sure you discuss any questions you have with your health care provider. Document Revised: 09/14/2021 Document Reviewed: 09/14/2021 Elsevier Patient Education  2024 [employer] How to Use Chlorhexidine  Before Surgery Chlorhexidine gluconate (CHG) is a germ-killing (antiseptic) solution that is used to clean the skin. It can get rid of the bacteria that normally live on the skin and can keep them away for about 24 hours. To clean your skin with CHG, you may be given: A CHG solution to use in the shower or as part of a sponge bath. A prepackaged cloth that contains CHG. Cleaning your skin with CHG may help lower the risk for infection: While you are staying in the intensive care unit of the hospital. If you have a vascular access, such as a central line, to provide short-term or long-term access to your veins. If you have a catheter to drain urine from your bladder.  If you are on a ventilator. A ventilator is a machine that helps you breathe by moving air in and out of your lungs. After surgery. What are the risks? Risks of using CHG include: A skin reaction. Hearing loss, if CHG gets in your ears and you have a perforated eardrum. Eye injury, if CHG gets in your eyes and is not rinsed out. The CHG product catching fire. Make sure that you avoid smoking and flames after applying CHG to your skin. Do not use CHG: If you have a chlorhexidine allergy or have previously reacted to chlorhexidine. On babies younger than 77 months of age. How to use CHG solution Use CHG only as told by your health care provider, and follow the instructions on the label. Use the full amount of CHG as directed. Usually, this is one bottle. During a shower Follow these steps when using CHG solution during a shower (unless your health care provider gives you different instructions): Start the shower. Use your normal soap and shampoo to wash your face and hair. Turn off the shower or move out of the shower stream. Pour the CHG onto a clean washcloth. Do not use any type of brush or rough-edged sponge. Starting at your neck, lather your body down to your toes. Make sure you follow these instructions: If you will be  having surgery, pay special attention to the part of your body where you will be having surgery. Scrub this area for at least 1 minute. Do not use CHG on your head or face. If the solution gets into your ears or eyes, rinse them well with water. Avoid your genital area. Avoid any areas of skin that have broken skin, cuts, or scrapes. Scrub your back and under your arms. Make sure to wash skin folds. Let the lather sit on your skin for 1-2 minutes or as long as told by your health care provider. Thoroughly rinse your entire body in the shower. Make sure that all body creases and crevices are rinsed well. Dry off with a clean towel. Do not put any substances on your body afterward--such as powder, lotion, or perfume--unless you are told to do so by your health care provider. Only use lotions that are recommended by the manufacturer. Put on clean clothes or pajamas. If it is the night before your surgery, sleep in clean sheets.  During a sponge bath Follow these steps when using CHG solution during a sponge bath (unless your health care provider gives you different instructions): Use your normal soap and shampoo to wash your face and hair. Pour the CHG onto a clean washcloth. Starting at your neck, lather your body down to your toes. Make sure you follow these instructions: If you will be having surgery, pay special attention to the part of your body where you will be having surgery. Scrub this area for at least 1 minute. Do not use CHG on your head or face. If the solution gets into your ears or eyes, rinse them well with water. Avoid your genital area. Avoid any areas of skin that have broken skin, cuts, or scrapes. Scrub your back and under your arms. Make sure to wash skin folds. Let the lather sit on your skin for 1-2 minutes or as long as told by your health care provider. Using a different clean, wet washcloth, thoroughly rinse your entire body. Make sure that all body creases and crevices  are rinsed well. Dry off with a clean towel. Do not put any substances  on your body afterward--such as powder, lotion, or perfume--unless you are told to do so by your health care provider. Only use lotions that are recommended by the manufacturer. Put on clean clothes or pajamas. If it is the night before your surgery, sleep in clean sheets. How to use CHG prepackaged cloths Only use CHG cloths as told by your health care provider, and follow the instructions on the label. Use the CHG cloth on clean, dry skin. Do not use the CHG cloth on your head or face unless your health care provider tells you to. When washing with the CHG cloth: Avoid your genital area. Avoid any areas of skin that have broken skin, cuts, or scrapes. Before surgery Follow these steps when using a CHG cloth to clean before surgery (unless your health care provider gives you different instructions): Using the CHG cloth, vigorously scrub the part of your body where you will be having surgery. Scrub using a back-and-forth motion for 3 minutes. The area on your body should be completely wet with CHG when you are done scrubbing. Do not rinse. Discard the cloth and let the area air-dry. Do not put any substances on the area afterward, such as powder, lotion, or perfume. Put on clean clothes or pajamas. If it is the night before your surgery, sleep in clean sheets.  For general bathing Follow these steps when using CHG cloths for general bathing (unless your health care provider gives you different instructions). Use a separate CHG cloth for each area of your body. Make sure you wash between any folds of skin and between your fingers and toes. Wash your body in the following order, switching to a new cloth after each step: The front of your neck, shoulders, and chest. Both of your arms, under your arms, and your hands. Your stomach and groin area, avoiding the genitals. Your right leg and foot. Your left leg and foot. The  back of your neck, your back, and your buttocks. Do not rinse. Discard the cloth and let the area air-dry. Do not put any substances on your body afterward--such as powder, lotion, or perfume--unless you are told to do so by your health care provider. Only use lotions that are recommended by the manufacturer. Put on clean clothes or pajamas. Contact a health care provider if: Your skin gets irritated after scrubbing. You have questions about using your solution or cloth. You swallow any chlorhexidine. Call your local poison control center [phone] in the [place] Get help right away if: Your eyes itch badly, or they become very red or swollen. Your skin itches badly and is red or swollen. Your hearing changes. You have trouble seeing. You have swelling or tingling in your mouth or throat. You have trouble breathing. These symptoms may represent a serious problem that is an emergency. Do not wait to see if the symptoms will go away. Get medical help right away. Call your local emergency services (911 in the [place] Do not drive yourself to the hospital. Summary Chlorhexidine gluconate (CHG) is a germ-killing (antiseptic) solution that is used to clean the skin. Cleaning your skin with CHG may help to lower your risk for infection. You may be given CHG to use for bathing. It may be in a bottle or in a prepackaged cloth to use on your skin. Carefully follow your health care provider's instructions and the instructions on the product label. Do not use CHG if you have a chlorhexidine allergy. Contact your health care provider if your skin  gets irritated after scrubbing. This information is not intended to replace advice given to you by your health care provider. Make sure you discuss any questions you have with your health care provider. Document Revised: 01/30/2022 Document Reviewed: 12/13/2020 Elsevier Patient Education  2023 [employer].

## 2023-05-23 ENCOUNTER — Encounter (HOSPITAL_COMMUNITY): Payer: Self-pay | Admitting: General Surgery

## 2023-05-23 ENCOUNTER — Other Ambulatory Visit (HOSPITAL_COMMUNITY): Payer: Self-pay | Admitting: General Surgery

## 2023-05-23 DIAGNOSIS — C50912 Malignant neoplasm of unspecified site of left female breast: Secondary | ICD-10-CM

## 2023-05-24 ENCOUNTER — Encounter (HOSPITAL_COMMUNITY)
Admission: RE | Admit: 2023-05-24 | Discharge: 2023-05-24 | Disposition: A | Discharge status: Home / Self Care | Payer: Medicaid Other | Source: Ambulatory Visit | Attending: General Surgery | Admitting: General Surgery

## 2023-05-24 ENCOUNTER — Encounter (HOSPITAL_COMMUNITY): Payer: Self-pay

## 2023-05-24 ENCOUNTER — Ambulatory Visit (HOSPITAL_COMMUNITY)
Admission: RE | Admit: 2023-05-24 | Discharge: 2023-05-24 | Disposition: A | Discharge status: Home / Self Care | Payer: Medicaid Other | Source: Ambulatory Visit | Attending: General Surgery | Admitting: General Surgery

## 2023-05-24 ENCOUNTER — Other Ambulatory Visit (HOSPITAL_COMMUNITY): Payer: Self-pay | Admitting: General Surgery

## 2023-05-24 VITALS — BP 111/69 | HR 70 | Temp 98.3°F | Resp 18 | Ht 64.0 in | Wt 142.0 lb

## 2023-05-24 DIAGNOSIS — Z170 Estrogen receptor positive status [ER+]: Secondary | ICD-10-CM

## 2023-05-24 DIAGNOSIS — Z01818 Encounter for other preprocedural examination: Secondary | ICD-10-CM | POA: Diagnosis present

## 2023-05-24 DIAGNOSIS — C801 Malignant (primary) neoplasm, unspecified: Secondary | ICD-10-CM | POA: Diagnosis not present

## 2023-05-24 DIAGNOSIS — C50912 Malignant neoplasm of unspecified site of left female breast: Secondary | ICD-10-CM

## 2023-05-24 DIAGNOSIS — C50412 Malignant neoplasm of upper-outer quadrant of left female breast: Secondary | ICD-10-CM | POA: Diagnosis present

## 2023-05-24 LAB — COMPREHENSIVE METABOLIC PANEL WITH GFR
ALT: 40 U/L (ref 0–44)
AST: 26 U/L (ref 15–41)
Albumin: 4.1 g/dL (ref 3.5–5.0)
Alkaline Phosphatase: 71 U/L (ref 38–126)
Anion gap: 9 (ref 5–15)
BUN: 12 mg/dL (ref 6–20)
CO2: 24 mmol/L (ref 22–32)
Calcium: 9.3 mg/dL (ref 8.9–10.3)
Chloride: 106 mmol/L (ref 98–111)
Creatinine, Ser: 0.85 mg/dL (ref 0.44–1.00)
GFR, Estimated: >60 mL/min
Glucose, Bld: 119 mg/dL — ABNORMAL HIGH (ref 70–99)
Potassium: 3.8 mmol/L (ref 3.5–5.1)
Sodium: 139 mmol/L (ref 135–145)
Total Bilirubin: 0.3 mg/dL (ref 0.3–1.2)
Total Protein: 7.2 g/dL (ref 6.5–8.1)

## 2023-05-24 LAB — CBC WITH DIFFERENTIAL/PLATELET
Abs Immature Granulocytes: 0.02 10*3/uL (ref 0.00–0.07)
Basophils Absolute: 0 10*3/uL (ref 0.0–0.1)
Basophils Relative: 0 %
Eosinophils Absolute: 0.1 10*3/uL (ref 0.0–0.5)
Eosinophils Relative: 1 %
HCT: 39.6 % (ref 36.0–46.0)
Hemoglobin: 13.7 g/dL (ref 12.0–15.0)
Immature Granulocytes: 0 %
Lymphocytes Relative: 30 %
Lymphs Abs: 2.1 10*3/uL (ref 0.7–4.0)
MCH: 33 pg (ref 26.0–34.0)
MCHC: 34.6 g/dL (ref 30.0–36.0)
MCV: 95.4 fL (ref 80.0–100.0)
Monocytes Absolute: 0.6 10*3/uL (ref 0.1–1.0)
Monocytes Relative: 9 %
Neutro Abs: 4.2 10*3/uL (ref 1.7–7.7)
Neutrophils Relative %: 60 %
Platelets: 165 10*3/uL (ref 150–400)
RBC: 4.15 MIL/uL (ref 3.87–5.11)
RDW: 12.7 % (ref 11.5–15.5)
WBC: 7 10*3/uL (ref 4.0–10.5)
nRBC: 0 % (ref 0.0–0.2)

## 2023-05-24 LAB — POCT PREGNANCY, URINE: Preg Test, Ur: NEGATIVE

## 2023-05-28 ENCOUNTER — Other Ambulatory Visit (HOSPITAL_COMMUNITY): Payer: Self-pay | Admitting: General Surgery

## 2023-05-28 ENCOUNTER — Ambulatory Visit (HOSPITAL_COMMUNITY)
Admission: RE | Admit: 2023-05-28 | Discharge: 2023-05-28 | Disposition: A | Discharge status: Home / Self Care | Payer: Medicaid Other | Source: Ambulatory Visit | Attending: General Surgery | Admitting: General Surgery

## 2023-05-28 ENCOUNTER — Encounter (HOSPITAL_COMMUNITY): Payer: Self-pay | Admitting: General Surgery

## 2023-05-28 ENCOUNTER — Encounter (HOSPITAL_COMMUNITY)
Admission: RE | Disposition: A | Discharge status: Home / Self Care | Payer: Self-pay | Source: Home / Self Care | Attending: General Surgery

## 2023-05-28 ENCOUNTER — Ambulatory Visit (HOSPITAL_BASED_OUTPATIENT_CLINIC_OR_DEPARTMENT_OTHER): Payer: Medicaid Other | Admitting: Anesthesiology

## 2023-05-28 ENCOUNTER — Ambulatory Visit (HOSPITAL_COMMUNITY): Payer: Medicaid Other | Admitting: Anesthesiology

## 2023-05-28 ENCOUNTER — Ambulatory Visit (HOSPITAL_COMMUNITY)
Admission: RE | Admit: 2023-05-28 | Discharge: 2023-05-28 | Disposition: A | Discharge status: Home / Self Care | Payer: Medicaid Other | Attending: General Surgery | Admitting: General Surgery

## 2023-05-28 ENCOUNTER — Other Ambulatory Visit: Payer: Self-pay

## 2023-05-28 DIAGNOSIS — Z170 Estrogen receptor positive status [ER+]: Secondary | ICD-10-CM | POA: Diagnosis not present

## 2023-05-28 DIAGNOSIS — Z800 Family history of malignant neoplasm of digestive organs: Secondary | ICD-10-CM | POA: Insufficient documentation

## 2023-05-28 DIAGNOSIS — C50412 Malignant neoplasm of upper-outer quadrant of left female breast: Secondary | ICD-10-CM | POA: Diagnosis present

## 2023-05-28 DIAGNOSIS — C50912 Malignant neoplasm of unspecified site of left female breast: Secondary | ICD-10-CM

## 2023-05-28 DIAGNOSIS — R928 Other abnormal and inconclusive findings on diagnostic imaging of breast: Secondary | ICD-10-CM

## 2023-05-28 DIAGNOSIS — Z801 Family history of malignant neoplasm of trachea, bronchus and lung: Secondary | ICD-10-CM | POA: Insufficient documentation

## 2023-05-28 DIAGNOSIS — Z808 Family history of malignant neoplasm of other organs or systems: Secondary | ICD-10-CM | POA: Insufficient documentation

## 2023-05-28 HISTORY — PX: PARTIAL MASTECTOMY WITH AXILLARY SENTINEL LYMPH NODE BIOPSY: SHX6004

## 2023-05-28 SURGERY — PARTIAL MASTECTOMY WITH AXILLARY SENTINEL LYMPH NODE BIOPSY
Anesthesia: General | Site: Breast | Laterality: Left

## 2023-05-28 MED ORDER — ROCURONIUM 10MG/ML (10ML) SYRINGE FOR MEDFUSION PUMP - OPTIME
INTRAVENOUS | Status: DC | PRN
  Administered 2023-05-28: 50 mg via INTRAVENOUS

## 2023-05-28 MED ORDER — CHLORHEXIDINE GLUCONATE CLOTH 2 % EX PADS: 6.0000 | MEDICATED_PAD | Freq: Once | CUTANEOUS | Status: DC

## 2023-05-28 MED ORDER — KETOROLAC TROMETHAMINE 30 MG/ML IJ SOLN
INTRAMUSCULAR | Status: DC | PRN
  Administered 2023-05-28: 30 mg via INTRAVENOUS

## 2023-05-28 MED ORDER — BUPIVACAINE HCL (PF) 0.5 % IJ SOLN
INTRAMUSCULAR | Status: AC
  Filled 2023-05-28: qty 30

## 2023-05-28 MED ORDER — MIDAZOLAM HCL 2 MG/2ML IJ SOLN
INTRAMUSCULAR | Status: AC
  Filled 2023-05-28: qty 2

## 2023-05-28 MED ORDER — LIDOCAINE HCL (PF) 2 % IJ SOLN
INTRAMUSCULAR | Status: AC
  Filled 2023-05-28: qty 5

## 2023-05-28 MED ORDER — HYDROMORPHONE HCL 1 MG/ML IJ SOLN
INTRAMUSCULAR | Status: AC
  Filled 2023-05-28: qty 0.5

## 2023-05-28 MED ORDER — FENTANYL CITRATE (PF) 100 MCG/2ML IJ SOLN
INTRAMUSCULAR | Status: AC
  Filled 2023-05-28: qty 2

## 2023-05-28 MED ORDER — ONDANSETRON HCL 4 MG/2ML IJ SOLN: 4.0000 mg | Freq: Once | INTRAMUSCULAR | Status: DC | PRN

## 2023-05-28 MED ORDER — BUPIVACAINE HCL (PF) 0.5 % IJ SOLN
INTRAMUSCULAR | Status: DC | PRN
  Administered 2023-05-28: 10 mL

## 2023-05-28 MED ORDER — LIDOCAINE HCL (CARDIAC) PF 50 MG/5ML IV SOSY
PREFILLED_SYRINGE | INTRAVENOUS | Status: DC | PRN
  Administered 2023-05-28: 80 mg via INTRAVENOUS

## 2023-05-28 MED ORDER — OXYCODONE HCL 5 MG PO TABS
5.0000 mg | ORAL_TABLET | ORAL | 0 refills | Status: DC | PRN
Start: 2023-05-28 — End: 2023-10-01

## 2023-05-28 MED ORDER — LACTATED RINGERS IV SOLN: INTRAVENOUS | Status: DC

## 2023-05-28 MED ORDER — MEPERIDINE HCL 50 MG/ML IJ SOLN: 6.2500 mg | INTRAMUSCULAR | Status: DC | PRN

## 2023-05-28 MED ORDER — DEXAMETHASONE SODIUM PHOSPHATE 10 MG/ML IJ SOLN
INTRAMUSCULAR | Status: DC | PRN
  Administered 2023-05-28: 8 mg via INTRAVENOUS

## 2023-05-28 MED ORDER — MAGTRACE LYMPHATIC TRACER
INTRAMUSCULAR | Status: DC | PRN
  Administered 2023-05-28: 2 mL via INTRAMUSCULAR

## 2023-05-28 MED ORDER — ENOXAPARIN SODIUM 40 MG/0.4ML IJ SOSY: 40.0000 mg | PREFILLED_SYRINGE | Freq: Once | INTRAMUSCULAR | Status: AC

## 2023-05-28 MED ORDER — CEFAZOLIN SODIUM-DEXTROSE 2-4 GM/100ML-% IV SOLN
INTRAVENOUS | Status: AC
  Filled 2023-05-28: qty 100

## 2023-05-28 MED ORDER — 0.9 % SODIUM CHLORIDE (POUR BTL) OPTIME
TOPICAL | Status: DC | PRN
  Administered 2023-05-28: 1000 mL

## 2023-05-28 MED ORDER — MIDAZOLAM HCL 5 MG/5ML IJ SOLN
INTRAMUSCULAR | Status: DC | PRN
  Administered 2023-05-28: 1 mg via INTRAVENOUS

## 2023-05-28 MED ORDER — CHLORHEXIDINE GLUCONATE 0.12 % MT SOLN
15.0000 mL | Freq: Once | OROMUCOSAL | Status: AC
  Administered 2023-05-28: 15 mL via OROMUCOSAL

## 2023-05-28 MED ORDER — ROCURONIUM BROMIDE 10 MG/ML (PF) SYRINGE
PREFILLED_SYRINGE | INTRAVENOUS | Status: AC
  Filled 2023-05-28: qty 10

## 2023-05-28 MED ORDER — ORAL CARE MOUTH RINSE: 15.0000 mL | Freq: Once | OROMUCOSAL | Status: AC

## 2023-05-28 MED ORDER — SUGAMMADEX SODIUM 200 MG/2ML IV SOLN
INTRAVENOUS | Status: DC | PRN
  Administered 2023-05-28: 100 mg via INTRAVENOUS

## 2023-05-28 MED ORDER — ENOXAPARIN SODIUM 40 MG/0.4ML IJ SOSY
PREFILLED_SYRINGE | INTRAMUSCULAR | Status: AC
  Administered 2023-05-28: 40 mg via SUBCUTANEOUS
  Filled 2023-05-28: qty 0.4

## 2023-05-28 MED ORDER — HYDROMORPHONE HCL 1 MG/ML IJ SOLN
0.2500 mg | INTRAMUSCULAR | Status: DC | PRN
  Administered 2023-05-28: 0.5 mg via INTRAVENOUS

## 2023-05-28 MED ORDER — FENTANYL CITRATE (PF) 100 MCG/2ML IJ SOLN
INTRAMUSCULAR | Status: DC | PRN
  Administered 2023-05-28 (×2): 50 ug via INTRAVENOUS

## 2023-05-28 MED ORDER — ONDANSETRON HCL 4 MG/2ML IJ SOLN
INTRAMUSCULAR | Status: AC
  Filled 2023-05-28: qty 2

## 2023-05-28 MED ORDER — CEFAZOLIN SODIUM-DEXTROSE 2-4 GM/100ML-% IV SOLN
2.0000 g | INTRAVENOUS | Status: AC
  Administered 2023-05-28: 2 g via INTRAVENOUS

## 2023-05-28 MED ORDER — PROPOFOL 10 MG/ML IV BOLUS
INTRAVENOUS | Status: DC | PRN
Start: 2023-05-28 — End: 2023-05-28
  Administered 2023-05-28: 160 mg via INTRAVENOUS

## 2023-05-28 SURGICAL SUPPLY — 37 items
ADH SKN CLS APL DERMABOND .7 (GAUZE/BANDAGES/DRESSINGS) ×1
APL PRP STRL LF DISP 70% ISPRP (MISCELLANEOUS) ×1
APPLIER CLIP 9.375 SM OPEN (CLIP) ×1
APR CLP SM 9.3 20 MLT OPN (CLIP) ×1
BLADE SURG 15 STRL LF DISP TIS (BLADE) ×1 IMPLANT
BLADE SURG 15 STRL SS (BLADE) ×1
CHLORAPREP W/TINT 26 (MISCELLANEOUS) ×1 IMPLANT
CLIP APPLIE 9.375 SM OPEN (CLIP) ×1 IMPLANT
COVER LIGHT HANDLE STERIS (MISCELLANEOUS) ×2 IMPLANT
COVER PROBE W GEL 5X96 (DRAPES) ×1 IMPLANT
DECANTER SPIKE VIAL GLASS SM (MISCELLANEOUS) ×1 IMPLANT
DERMABOND ADVANCED .7 DNX12 (GAUZE/BANDAGES/DRESSINGS) ×1 IMPLANT
DISSECTOR SURG LIGASURE 21 (MISCELLANEOUS) ×1 IMPLANT
ELECT REM PT RETURN 9FT ADLT (ELECTROSURGICAL) ×1
ELECTRODE REM PT RTRN 9FT ADLT (ELECTROSURGICAL) ×1 IMPLANT
FORCEPS ALLIS DISP 8 (DISPOSABLE) IMPLANT
GLOVE BIOGEL PI IND STRL 7.0 (GLOVE) ×2 IMPLANT
GLOVE SURG SS PI 7.5 STRL IVOR (GLOVE) ×2 IMPLANT
GOWN STRL REUS W/TWL LRG LVL3 (GOWN DISPOSABLE) ×2 IMPLANT
KIT TURNOVER KIT A (KITS) ×1 IMPLANT
NDL HYPO 18GX1.5 BLUNT FILL (NEEDLE) ×1 IMPLANT
NDL HYPO 21X1.5 SAFETY (NEEDLE) ×1 IMPLANT
NEEDLE HYPO 18GX1.5 BLUNT FILL (NEEDLE) ×1 IMPLANT
NEEDLE HYPO 21X1.5 SAFETY (NEEDLE) ×1 IMPLANT
NS IRRIG 1000ML POUR BTL (IV SOLUTION) ×1 IMPLANT
PACK MINOR (CUSTOM PROCEDURE TRAY) ×1 IMPLANT
PAD ARMBOARD 7.5X6 YLW CONV (MISCELLANEOUS) ×2 IMPLANT
POSITIONER HEAD 8X9X4 ADT (SOFTGOODS) ×1 IMPLANT
RETRACTOR HANDHELD DISP 8.5 (DISPOSABLE) IMPLANT
SET BASIN LINEN APH (SET/KITS/TRAYS/PACK) ×1 IMPLANT
SUT MNCRL AB 4-0 PS2 18 (SUTURE) ×1 IMPLANT
SUT SILK 2 0 SH (SUTURE) ×1 IMPLANT
SUT VIC AB 3-0 SH 27 (SUTURE) ×1
SUT VIC AB 3-0 SH 27X BRD (SUTURE) ×1 IMPLANT
SYR 10ML LL (SYRINGE) ×1 IMPLANT
SYR 30ML LL (SYRINGE) ×1 IMPLANT
SYR CONTROL 10ML LL (SYRINGE) ×1 IMPLANT

## 2023-05-28 NOTE — Interval H&P Note (Signed)
History and Physical Interval Note:  05/28/2023 8:44 AM  Lindsay Peters  has presented today for surgery, with the diagnosis of Malignant neoplasm of upper-outer quadrant of left breast in female, estrogen receptor positive.  The various methods of treatment have been discussed with the patient and family. After consideration of risks, benefits and other options for treatment, the patient has consented to  Procedure(s): PARTIAL MASTECTOMY WITH AXILLARY SENTINEL LYMPH NODE BIOPSY AND WITH RADIOFREQUENCY TAG (Left) as a surgical intervention.  The patient's history has been reviewed, patient examined, no change in status, stable for surgery.  I have reviewed the patient's chart and labs.  Questions were answered to the patient's satisfaction.     [name]

## 2023-05-28 NOTE — Anesthesia Postprocedure Evaluation (Signed)
Anesthesia Post Note  Patient: Lindsay Peters  Procedure(s) Performed: PARTIAL MASTECTOMY WITH AXILLARY SENTINEL LYMPH NODE BIOPSY AND WITH RADIOFREQUENCY TAG (Left: Breast)  Patient location during evaluation: Phase II Anesthesia Type: General Level of consciousness: awake and alert and oriented Pain management: pain level controlled Vital Signs Assessment: post-procedure vital signs reviewed and stable Respiratory status: spontaneous breathing, nonlabored ventilation and respiratory function stable Cardiovascular status: blood pressure returned to baseline and stable Postop Assessment: no apparent nausea or vomiting Anesthetic complications: no  No notable events documented.   Last Vitals:  Vitals:   05/28/23 1326 05/28/23 1332  BP: 111/87 108/80  Pulse: 85 81  Resp: (!) 28   Temp:  36.7 C  SpO2: 99% 98%    Last Pain:  Vitals:   05/28/23 1332  TempSrc: Oral  PainSc: 0-No pain                  C 

## 2023-05-28 NOTE — Op Note (Signed)
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  The patient now presents for left partial mastectomy after radiofrequency tag placement, sentinel lymph node biopsy.  The risks and benefits of the procedures including bleeding, infection, pain, incomplete resection, and nerve injury were fully explained to the patient, who gave informed consent.  Procedure note: The patient was placed in the supine position.  After general anesthesia was administered, the left breast and axilla were prepped and draped using usual sterile technique with ChloraPrep.  Surgical site confirmation was performed.  A subareolar injection of Magtrace was performed and massaged.  Using the Hologic localizer, an elliptical incision was made as the tag was noted to be approximately 1 mm below the skin.  The dissection was continued around this area.  A short suture was placed superiorly and a long suture placed laterally for orientation purposes.  Specimen radiography revealed the ribbon clip and tag within the specimen removed.  It was noted to be very anterior.  I purposely included skin over this area in order to get clear margins.  The specimen was then sent to pathology further examination.  Using the mag trace localizer, a sentinel lymph node dissection was performed in the left axilla.  2 sentinel lymph nodes were identified and removed using the hand-held LigaSure.  After the dissection was complete, the basin counts were less than 10% of the ex vivo count.  The lymph nodes were sent to pathology further  examination.  A bleeding was controlled using Bovie electrocautery.  Both wounds were irrigated with normal saline.  Both wounds were injected with 0.5% Sensorcaine.  Both incisions were closed using 3-0 Vicryl subcutaneous sutures.  4-0 Monocryl subcuticular sutures were then placed.  Dermabond was applied.  All tape and needle counts were correct at the end of the procedure.  The patient was awakened and transferred to PACU in stable condition.  Complications: None  EBL: Minimal  Specimen: Left breast tissue, left axillary sentinel lymph nodes

## 2023-05-28 NOTE — Anesthesia Procedure Notes (Signed)
Procedure Name: Intubation Date/Time: 05/28/2023 11:08 AM  Performed by: Moshe Salisbury, CRNAPre-anesthesia Checklist: Patient identified, Patient being monitored, Timeout performed, Emergency Drugs available and Suction available Patient Re-evaluated:Patient Re-evaluated prior to induction Oxygen Delivery Method: Circle system utilized Preoxygenation: Pre-oxygenation with 100% oxygen Induction Type: IV induction Ventilation: Mask ventilation without difficulty Laryngoscope Size: Mac and 3 Grade View: Grade I Tube type: Oral Tube size: 7.0 mm Number of attempts: 1 Airway Equipment and Method: Stylet Placement Confirmation: ETT inserted through vocal cords under direct vision, positive ETCO2 and breath sounds checked- equal and bilateral Secured at: 21 cm Tube secured with: Tape Dental Injury: Teeth and Oropharynx as per pre-operative assessment

## 2023-05-28 NOTE — Transfer of Care (Signed)
Immediate Anesthesia Transfer of Care Note  Patient: Lindsay Peters  Procedure(s) Performed: PARTIAL MASTECTOMY WITH AXILLARY SENTINEL LYMPH NODE BIOPSY AND WITH RADIOFREQUENCY TAG (Left: Breast)  Patient Location: PACU  Anesthesia Type:General  Level of Consciousness: awake  Airway & Oxygen Therapy: Patient Spontanous Breathing  Post-op Assessment: Report given to RN  Post vital signs: Reviewed and stable  Last Vitals:  Vitals Value Taken Time  BP    Temp    Pulse    Resp    SpO2      Last Pain:  Vitals:   05/28/23 0858  TempSrc: Oral  PainSc: 0-No pain      Patients Stated Pain Goal: 5 (05/28/23 0858)  Complications: No notable events documented.

## 2023-05-28 NOTE — Anesthesia Preprocedure Evaluation (Signed)
Anesthesia Evaluation  Patient identified by MRN, date of birth, ID band Patient awake    Reviewed: Allergy & Precautions, H&P , NPO status , Patient's Chart, lab work & pertinent test results  Airway Mallampati: II  TM Distance: >3 FB Neck ROM: Full    Dental  (+) Dental Advisory Given, Chipped   Pulmonary neg pulmonary ROS   Pulmonary exam normal breath sounds clear to auscultation       Cardiovascular negative cardio ROS Normal cardiovascular exam Rhythm:Regular Rate:Normal     Neuro/Psych negative neurological ROS  negative psych ROS   GI/Hepatic negative GI ROS, Neg liver ROS,,,  Endo/Other  negative endocrine ROS    Renal/GU negative Renal ROS  negative genitourinary   Musculoskeletal negative musculoskeletal ROS (+)    Abdominal   Peds negative pediatric ROS (+)  Hematology negative hematology ROS (+)   Anesthesia Other Findings   Reproductive/Obstetrics negative OB ROS                             Anesthesia Physical Anesthesia Plan  ASA: 2  Anesthesia Plan: General   Post-op Pain Management: Dilaudid IV   Induction: Intravenous  PONV Risk Score and Plan: 4 or greater and Ondansetron, Dexamethasone and Midazolam  Airway Management Planned: Oral ETT  Additional Equipment:   Intra-op Plan:   Post-operative Plan: Extubation in OR  Informed Consent: I have reviewed the patients History and Physical, chart, labs and discussed the procedure including the risks, benefits and alternatives for the proposed anesthesia with the patient or authorized representative who has indicated his/her understanding and acceptance.     Dental advisory given  Plan Discussed with: CRNA and Surgeon  Anesthesia Plan Comments:        Anesthesia Quick Evaluation

## 2023-05-30 ENCOUNTER — Encounter (HOSPITAL_COMMUNITY): Payer: Self-pay | Admitting: General Surgery

## 2023-06-05 ENCOUNTER — Encounter: Payer: Self-pay | Admitting: General Surgery

## 2023-06-05 ENCOUNTER — Other Ambulatory Visit: Payer: Self-pay

## 2023-06-05 ENCOUNTER — Ambulatory Visit (INDEPENDENT_AMBULATORY_CARE_PROVIDER_SITE_OTHER): Payer: Self-pay | Admitting: General Surgery

## 2023-06-05 VITALS — BP 116/63 | HR 55 | Temp 98.0°F | Resp 16 | Ht 64.0 in | Wt 142.0 lb

## 2023-06-05 DIAGNOSIS — Z09 Encounter for follow-up examination after completed treatment for conditions other than malignant neoplasm: Secondary | ICD-10-CM

## 2023-06-05 NOTE — Progress Notes (Signed)
Subjective:     Lindsay Peters  Patient here for postoperative visit, status post left partial mastectomy with sentinel lymph node biopsy.  She is doing well.  She has no complaints. Objective:    BP 116/63   Peters (!) 55   Temp 98 F (36.7 C) (Oral)   Resp 16   Ht 5\' 4"  (1.626 m)   Wt 142 lb (64.4 kg)   LMP  (LMP Unknown)   SpO2 98%   BMI 24.37 kg/m   General:  alert, cooperative, and no distress  Left breast incision healing well.  Resolving ecchymosis present.  Left axillary incision healing well.   Results Surgical pathology (Order [id number]) MyChart Results Release  MyChart Status: Pending  Results Release   Cervical Cancer Screening - Results and Follow-ups Selected result Result date Tests and Procedures Follow-ups  05/28/2023  Surgical pathology  SURGICAL PATHOLOGY: SURGICAL PATHOLOGY CASE: [id number] PATIENT: Lindsay Peters Surgical Pathology Report     Clinical History: Malignant neoplasm of upper-outer quadrant of left breast in female, estrogen receptor positive (crm)     FINAL MICROSCOPIC DIAGNOS...   Cervical Cancer Screening History Report Surgical pathology Order: [id number] Status: Edited Result - FINAL     Visible to patient: No (inaccessible in MyChart)     Next appt: 06/20/2023 at 11:45 AM in Oncology [name], MD)   0 Result Notes    Component 8 d ago  SURGICAL PATHOLOGY SURGICAL PATHOLOGY CASE: [id number] PATIENT: Lindsay Peters Surgical Pathology Report     Clinical History: Malignant neoplasm of upper-outer quadrant of left breast in female, estrogen receptor positive (crm)     FINAL MICROSCOPIC DIAGNOSIS:  A.   BREAST, LEFT, PARTIAL MASTECTOMY: Invasive ductal carcinoma with extracellular mucin, 0.6 cm, grade 1 (pT1b) Margins, invasive: Negative     Closest, invasive: 5 mm, medial and inferior Lymphovascular invasion: Not identified Prognostic markers:  ER positive, PR positive, Her2 negative, Ki-67  5% Other: None See oncology table  B.   SENTINEL LYMPH NODE, LEFT AXILLARY, BIOPSY: One lymph node negative for metastatic carcinoma (0/1)  C.   SENTINEL LYMPH NODE, LEFT AXILLARY #2, BIOPSY: One lymph node negative for metastatic carcinoma (0/1)  D.   SENTINEL LYMPH NODE, LEFT AXILLARY #3, BIOPSY: Benign adipose tissue No lymph node tissue or malignancy  ONCOLOGY TABLE: INVASIVE CARCINOMA OF THE BREAST:  Resection Procedure: Left breast lumpectomy and two sentinel lymph nodes Specimen Laterality: Left Histologic Type: Ductal with extracellular mucin Histologic Grade:      Glandular (Acinar)/Tubular Differentiation: 3      Nuclear Pleomorphism: 1      Mitotic Rate: 1      Overall Grade: 1 Tumor Size: 0.6 x 0.6 x 0.5 cm Lymphatic and/or Vascular Invasion: Not identified Treatment Effect in the Breast: No known presurgical therapy Margins: All surgical margins negative for invasive carcinoma      Distance from Closest Margin (mm): 5 mm      Specify Closest Margin (required only if <17mm): Medial and inferior Regional Lymph Nodes:      Number of Lymph Nodes Examined: 2      Number of Sentinel Nodes Examined: 2      Number of Lymph Nodes with Macrometastases (>2 mm): 0      Number of Lymph Nodes with Micrometastases: 0      Number of Lymph Nodes with Isolated Tumor Cells (=0.2 mm or =200 cells): 0 Distant Metastasis:      Distant Site(s) Involved: Not applicable Breast Biomarker  Testing Performed on Previous Biopsy:      Testing Performed on Case Number: [id number]            Estrogen Receptor: 90%, positive, strong staining intensity            Progesterone Receptor: 45%, positive, strong staining intensity            HER2: Negative (1+)            Ki-67: 5% Pathologic Stage Classification (pTNM, AJCC 8th Edition): pT1b, pN0 Representative Tumor Block: A2 and A3 (v4.5.0.0)  GROSS DESCRIPTION: A: Specimen type: Left breast lumpectomy, received in formalin. Size: 4.2  cm at the lateral-medial axis, 4 cm at the anterior-posterior axis and 2.6 cm at the superior-inferior axis.  There is a 2.1 x 0.5 cm portion of skin at the anterior surface. Orientation: The specimen is oriented with a short suture at superior and long suture at lateral.  The margins are inked with anterior green, inferior blue, lateral orange, medial yellow, posterior black and superior red. Localized area: There is an RF device present. Cut surface: At the localized area there is a 0.6 x 0.6 x 0.5 cm indurated white nodule with a ribbon biopsy clip.  The remainder the breast tissue consists of soft yellow adipose tissue and focally dense white fibrous tissue. Margins: The nodule is 0.5 cm from the junction of the medial and inferior margins.  The remaining margins measure greater than 1 cm. Prognostic indicators: Obtained from paraffin blocks if needed. Block summary: 6 blocks submitted 1-3 = entire nodule to include the medial and inferior margins 4 = anterior margin to include skin 5 = superior margin 6 = lateral and posterior margins  B: B: Received in formalin is a 0.7 x 0.7 x 0.6 cm rubbery tan-yellow nodule.  The specimen is bisected and entirely submitted in 1 cassette.  C: Received fresh is a 1 x 0.9 x 0.7 cm rubbery tan-yellow nodule.  The specimen is sectioned and entirely submitted in 1 cassette.  D: Received fresh is a 1 x 0.6 x 0.5 cm rubbery tan-yellow nodule.  The specimen is bisected and entirely submitted in 1 cassette.  [facility] 05/29/2023)  Final Diagnosis performed by Jimmy Picket, MD.   Electronically signed 05/30/2023 Technical component performed at [facility], [address] [place], [place] [address].  Professional component performed at [employer]. [facility], [address], [place], [place] [address].  Immunohistochemistry Technical component (if applicable) was performed at [facility]. [address], [place], [place] [address].   IMMUNOHISTOCHEMISTRY DISCLAIMER (if applicable): Some of these immunohistochemical stains may have been developed and the performance characteristics determine by [facility]. Some may not have been cleared or approved by the U.S. Food and Drug Administration. The FDA has determined that such clearance or approval is not necessary. This test is used for clinical purposes. It should not be regarded as investigational or for research. This laboratory is certified under the Clinical Laboratory Improvement Amendments of 1988 (CLIA-88) as qualified to perform high complexity clinical laboratory testing.  The controls stained appropriately.   IHC stains are performed on formalin fixed, paraffin embedded tissue using a 3,3"diaminobenzidine (DAB) chromogen and Leica Bond Autostainer System. The staining intensity of the nucleus is score manually and is reported as the percentage of tumor cell nuclei demonstrating specific nuclear staining. The specimens are fixed in 10% Neutral Formalin for at least 6 hours and up to 72hrs. These  tests are validated on decalcified tissue. Results should be interpreted with caution given the possibility of false negative results on decalcified specimens. Antibody Clones are as follows ER-clone 52F, PR-clone 16, Ki67- clone MM1. Some of these immunohistochemical stains may have been developed and the performance characteristics determined by [facility].  Resulting Agency [facility]         Specimen Collected: 05/28/23 11:34 Last Resulted: 05/30/23 13:18      Lab Flowsheet      Order Details      View Encounter      Lab and Collection Details      Routing      Result History    View All Conversations on this Encounter        Linked Documents  View Image    Result Care Coordination   Patient Communication   Add Comments   Not seen Back to Top     In Basket Actions  Done Result  Mgmt   View in In [place] Result Information  Status Priority Source  Edited Result - FINAL (05/30/2023 1318) STAT PATH Breast lump/simple mastectomy   Authorizing Provider Information  Name: [name], MD Fax: [phone]  Phone: [phone] Pager:    Status of Active Orders View All Orders From This EncounterExpected   Increase activity slowly [[id number] Custom] 05/28/23  Diet - low sodium heart healthy [DIET9 Custom] 05/28/23   View SmartLink Info  Surgical pathology (Order #[id number]) on 05/28/23       Assessment:    Doing well postoperatively.    Plan:   Will refer to Dr. Ellin Saba of Oncology for further examination and treatment.  Follow-up here as needed.

## 2023-06-12 ENCOUNTER — Telehealth: Payer: Self-pay

## 2023-06-12 NOTE — Telephone Encounter (Signed)
Patient was contacted and informed that per [facility] DSS, [employer] Medicaid application is still pending. Also, patient asked about getting food stamps (EBT), was told to contact DSS once her medicaid is approved. Patient verbalized understanding.

## 2023-06-20 ENCOUNTER — Inpatient Hospital Stay: Payer: Medicaid Other | Admitting: Hematology

## 2023-06-25 ENCOUNTER — Inpatient Hospital Stay: Payer: Medicaid Other | Admitting: Hematology

## 2023-06-27 ENCOUNTER — Telehealth: Payer: Self-pay

## 2023-06-27 NOTE — Progress Notes (Signed)
[facility] [address] [place] [address]   Clinic Day:  06/28/23    Referring physician: No ref. provider found  Patient Care Team: Pcp, No as PCP - General [name], MD as Medical Oncologist (Medical Oncology) Therese Sarah, RN as Oncology Nurse Navigator (Medical Oncology)   ASSESSMENT & PLAN:   Assessment:  1.  Stage I (T1a N0 G1 ER/PR+, HER2-) left breast IDC: - Screening mammogram (03/30/2023): Possible asymmetry in the left breast. - Diagnostic mammogram (04/12/2023): Hypoechoic mass with margin irregularity in the left breast at 3:00 3 cm from nipple measuring 0.5 x 0.3 x 0.4 cm.  No lymphadenopathy in the axilla. 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  0/2 sentinel lymph nodes positive.  pT1b pN0. - Oncotype DX RS-15.  Distant recurrence risk at 9 years with AI/tamoxifen-4%.  Absolute chemotherapy benefit less than 1%. - Germline mutation testing: Negative. - Anastrozole started on 06/28/2023  2.  Social/family history: - Lives with boyfriend at home and is independent of ADLs and IADLs.  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  Non-smoker. - Father died of lung cancer. - Maternal grandmother had colon cancer.  Maternal aunt had melanoma.  Plan:  1.  Stage I (T1a N0 G1 ER/PR+, HER2-) left breast IDC: - She underwent lumpectomy and SLNB by Dr. Lovell Sheehan on 05/28/2023. - We reviewed pathology report in detail. - We reviewed Oncotype DX results. - I have also reviewed germline mutation testing results which were negative. - I did not recommend adjuvant chemotherapy. - She stopped menstruating 4 years ago.  Recommend antiestrogen therapy with  anastrozole. - Discussed side effects including vasomotor symptoms, musculoskeletal symptoms, decreased bone mineral density among others. - Will make a referral to radiation oncology. - RTC 3 months with repeat labs.  2.  Osteopenia (DEXA scan 05/10/2023 T-score -1.9): - We reviewed results of the bone density test which showed osteopenia. - Recommend taking calcium and vitamin D supplements. - Also recommend weightbearing exercises. - Plan to repeat DEXA scan in 2 years.  Orders Placed This Encounter  Procedures   CBC with Differential    Standing Status:   Future    Standing Expiration Date:   06/27/2024   Comprehensive metabolic panel    Standing Status:   Future    Standing Expiration Date:   06/27/2024   VITAMIN D 25 Hydroxy (Vit-D Deficiency, Fractures)    Standing Status:   Future    Standing Expiration Date:   06/27/2024      Alben Deeds Teague,acting as a scribe for [name], MD.,have documented all relevant documentation on the behalf of [name], MD,as directed by  [name], MD while in the presence of [name], MD.  I, [name] MD, have reviewed the above documentation for accuracy and completeness, and I agree with the above.    [name], MD   9/12/20245:06 PM  CHIEF COMPLAINT/PURPOSE OF CONSULT:   Diagnosis: left breast cancer   Cancer Staging  Breast cancer of upper-outer quadrant of left female breast [facility]) Staging form: Breast, AJCC 8th Edition - Clinical stage from 05/02/2023: Stage IA (cT1b, cN0(sn), cM0, G1, ER+, PR+, HER2-) - Signed by [name], MD on 06/28/2023  Prior Therapy: none  Current Therapy:  none   HISTORY OF PRESENT ILLNESS:   Oncology History  Breast cancer of upper-outer quadrant of left female breast (HCC)  05/02/2023 Initial Diagnosis   Breast cancer of upper-outer quadrant of left female breast (HCC)   05/02/2023 Cancer Staging   Staging form: Breast,  AJCC 8th Edition - Clinical stage from 05/02/2023: Stage IA (cT1b, cN0(sn), cM0, G1, ER+, PR+, HER2-) - Signed by [name], MD on 06/28/2023 Histopathologic type: Infiltrating duct carcinoma, NOS Stage prefix: Initial diagnosis Method of lymph node assessment: Sentinel lymph node biopsy Nuclear grade: G1 Histologic grading system: 3 grade system       Lindsay Peters is a 47 y.o. female presenting to clinic today for evaluation of left breast cancer from a recent mammogram.  Patient underwent a unilateral left mammogram on 6/27, after a bilateral mammogram on 6/14 that found asymmetry on the left breast, which found indeterminate 0.5 cm mass in the left breast at the 3 o'clock position. She had an US of the left breast on 6/27 which found the same as above. 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   Today, she states that she is doing well overall. Her appetite level is at 100%. Her energy level is at 100%. She is accompanied by her mother.  She denies ever feeling a lump in her left breast. Her father died of lung cancer, maternal grandmother had colon cancer, and her maternal aunt had melanoma. Her mother has osteoporosis. She lives with her boyfriend and is able to do activities around the house and drive without any assistance. 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 She denies tobacco use. Her menarche was at age 74 and she went through menopause at age 62. She does not have children. She has used birth control for a couple of years, but denies using hormone replacement pills. She has not had a biopsy of the breast before 7/2. She takes vitamins and Tylenol prn. She denies ankle swelling.   Breast Exam Chaperone: Kennith Gain, RN  INTERVAL HISTORY:   Lindsay Peters is a 47 y.o. female presenting to clinic today for follow-up of left breast  cancer. She was last seen by me on 05/02/23 in consultation.  Since her last visit, she underwent a partial mastectomy of the left breast on 05/28/23 with Dr. Lovell Sheehan. Surgical pathology of the breast revealed: invasive ductal carcinoma with extracellular mucin, 0.6 cm, grade 1 (pT1b) and margins negative for carcinoma. Prognostic markers were ER/PR positive and Her2 negative. All lymph node biopsies were negative for metastatic carcinoma.   She also had a bone density test done on 05/10/23 with a T-score of -1.9, making her osteopenic.   Today, she states that she is doing well overall. Her appetite level is at 100%. Her energy level is at 80%. She is accompanied by her mother.   She notes her surgery went well. She denies any spotting after her last menstruation at age 18. She notes she takes Calcium and Vitamin D supplements. She lives in [place], [place].   PAST MEDICAL HISTORY:   Past Medical History: No past medical history on file.  Surgical History: 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, grade 1 [place] LT BREAST BX W LOC  DEV 1ST LESION IMG BX SPEC US GUIDE 04/17/2023 Edwin Cap, MD [facility]   NO PAST SURGERIES     PARTIAL MASTECTOMY WITH AXILLARY SENTINEL LYMPH NODE BIOPSY Left 05/28/2023   Procedure: PARTIAL MASTECTOMY WITH AXILLARY SENTINEL LYMPH NODE BIOPSY AND WITH RADIOFREQUENCY TAG;  Surgeon: [name], MD;  Location: [facility];  Service: General;  Laterality: Left;    Social History: Social History   Socioeconomic History   Marital status: Single    Spouse name: Not on file   Number of children: 0   Years of education: Not on file   Highest education level: High school graduate  Occupational History   Not on file  Tobacco Use   Smoking status: Never   Smokeless tobacco: Never  Vaping Use   Vaping status: Never Used  Substance and  Sexual Activity   Alcohol use: Never   Drug use: Never   Sexual activity: Not Currently  Other Topics Concern   Not on file  Social History Narrative   Not on file   Social Determinants of Health   Financial Resource Strain: Not on file  Food Insecurity: No Food Insecurity (03/30/2023)   Hunger Vital Sign    Worried About Running Out of Food in the Last Year: Never true    Ran Out of Food in the Last Year: Never true  Transportation Needs: No Transportation Needs (03/30/2023)   PRAPARE - Administrator, Civil Service (Medical): No    Lack of Transportation (Non-Medical): No  Physical Activity: Not on file  Stress: Not on file  Social Connections: Not on file  Intimate Partner Violence: Not At Risk (05/02/2023)   Humiliation, Afraid, Rape, and Kick questionnaire    Fear of Current or Ex-Partner: No    Emotionally Abused: No    Physically Abused: No    Sexually Abused: No    Family History: Family History  Problem Relation Age of Onset   Diverticulosis Mother    Lung cancer Father    Melanoma Maternal Aunt        died in 07/07/99   Diabetes Maternal Grandmother    Colon cancer Maternal Grandmother        died 07-Jul-2007    Current Medications:  Current Outpatient Medications:    anastrozole (ARIMIDEX) 1 MG tablet, Take 1 tablet (1 mg total) by mouth daily., Disp: 90 tablet, Rfl: 3   Ascorbic Acid (VITAMIN C) 1000 MG tablet, Take 1,000 mg by mouth daily., Disp: , Rfl:    Calcium Carb-Cholecalciferol (CALCIUM + VITAMIN D3) 600-10 MG-MCG TABS, Take 1 tablet by mouth daily., Disp: , Rfl:    Cyanocobalamin 2500 MCG TABS, Take 2,500 mcg by mouth daily., Disp: , Rfl:    Multiple Vitamin (MULTIVITAMIN) capsule, Take 1 capsule by mouth daily., Disp: , Rfl:    oxyCODONE (ROXICODONE) 5 MG immediate release tablet, Take 1 tablet (5 mg total) by mouth every 4 (four) hours as needed., Disp: 15 tablet, Rfl: 0   Allergies: Allergies  Allergen Reactions   Sulfa Antibiotics Rash     REVIEW OF SYSTEMS:   Review of Systems  Constitutional:  Negative for chills, fatigue and fever.  HENT:   Negative for lump/mass, mouth sores, nosebleeds, sore throat and trouble swallowing.   Eyes:  Negative for eye problems.  Respiratory:  Negative for cough and shortness of breath.   Cardiovascular:  Negative for chest pain, leg swelling and palpitations.  Gastrointestinal:  Negative for abdominal pain, constipation, diarrhea, nausea and  vomiting.  Genitourinary:  Negative for bladder incontinence, difficulty urinating, dysuria, frequency, hematuria and nocturia.   Musculoskeletal:  Negative for arthralgias, back pain, flank pain, myalgias and neck pain.  Skin:  Negative for itching and rash.  Neurological:  Negative for dizziness, headaches and numbness.  Hematological:  Does not bruise/bleed easily.  Psychiatric/Behavioral:  Positive for sleep disturbance. Negative for depression and suicidal ideas. The patient is not nervous/anxious.   All other systems reviewed and are negative.    VITALS:   Blood pressure 128/78, pulse 73, temperature 97.8 F (36.6 C), temperature source Oral, resp. rate 16, weight 142 lb 12.8 oz (64.8 kg), SpO2 99%.  Wt Readings from Last 3 Encounters:  06/28/23 142 lb 12.8 oz (64.8 kg)  06/05/23 142 lb (64.4 kg)  05/28/23 142 lb (64.4 kg)    Body mass index is 24.51 kg/m.  Performance status (ECOG): 1 - Symptomatic but completely ambulatory  PHYSICAL EXAM:   Physical Exam Vitals and nursing note reviewed. Exam conducted with a chaperone present.  Constitutional:      Appearance: Normal appearance.  Cardiovascular:     Rate and Rhythm: Normal rate and regular rhythm.     Pulses: Normal pulses.     Heart sounds: Normal heart sounds.  Pulmonary:     Effort: Pulmonary effort is normal.     Breath sounds: Normal breath sounds.  Chest:     Comments: +Left lumpectomy scar in lower outer quadrant is well healed +lymph node biopsy scar is well  healed  Abdominal:     Palpations: Abdomen is soft. There is no hepatomegaly, splenomegaly or mass.     Tenderness: There is no abdominal tenderness.  Musculoskeletal:     Right lower leg: No edema.     Left lower leg: No edema.  Lymphadenopathy:     Cervical: No cervical adenopathy.     Right cervical: No superficial, deep or posterior cervical adenopathy.    Left cervical: No superficial, deep or posterior cervical adenopathy.     Upper Body:     Right upper body: No supraclavicular or axillary adenopathy.     Left upper body: No supraclavicular or axillary adenopathy.  Neurological:     General: No focal deficit present.     Mental Status: She is alert and oriented to person, place, and time.  Psychiatric:        Mood and Affect: Mood normal.        Behavior: Behavior normal.     LABS:      Latest Ref Rng & Units 05/24/2023    8:38 AM 05/02/2023    2:29 PM  CBC  WBC 4.0 - 10.5 K/uL 7.0  8.6   Hemoglobin 12.0 - 15.0 g/dL 10.9  32.3   Hematocrit 36.0 - 46.0 % 39.6  40.3   Platelets 150 - 400 K/uL 165  191       Latest Ref Rng & Units 05/24/2023    8:38 AM 05/02/2023    2:29 PM  CMP  Glucose 70 - 99 mg/dL 557  322   BUN 6 - 20 mg/dL 12  14   Creatinine 0.25 - 1.00 mg/dL 4.27  0.62   Sodium 376 - 145 mmol/L 139  137   Potassium 3.5 - 5.1 mmol/L 3.8  3.4   Chloride 98 - 111 mmol/L 106  101   CO2 22 - 32 mmol/L 24  27   Calcium 8.9 - 10.3 mg/dL 9.3  9.8   Total Protein  6.5 - 8.1 g/dL 7.2  7.5   Total Bilirubin 0.3 - 1.2 mg/dL 0.3  0.5   Alkaline Phos 38 - 126 U/L 71  77   AST 15 - 41 U/L 26  27   ALT 0 - 44 U/L 40  42      No results found for: "CEA1", "CEA" / No results found for: "CEA1", "CEA" No results found for: "PSA1" No results found for: "XLK440" No results found for: "CAN125"  No results found for: "TOTALPROTELP", "ALBUMINELP", "A1GS", "A2GS", "BETS", "BETA2SER", "GAMS", "MSPIKE", "SPEI" No results found for: "TIBC", "FERRITIN", "IRONPCTSAT" No results  found for: "LDH"   STUDIES:   No results found.

## 2023-06-27 NOTE — Telephone Encounter (Signed)
[employer] Medicaid approved 04/16/2023-04/14/2024, MID: [id number] K. Patient informed.

## 2023-06-28 ENCOUNTER — Inpatient Hospital Stay: Payer: Medicaid Other | Attending: Hematology | Admitting: Hematology

## 2023-06-28 VITALS — BP 128/78 | HR 73 | Temp 97.8°F | Resp 16 | Wt 142.8 lb

## 2023-06-28 DIAGNOSIS — Z79811 Long term (current) use of aromatase inhibitors: Secondary | ICD-10-CM | POA: Insufficient documentation

## 2023-06-28 DIAGNOSIS — Z170 Estrogen receptor positive status [ER+]: Secondary | ICD-10-CM | POA: Diagnosis not present

## 2023-06-28 DIAGNOSIS — Z800 Family history of malignant neoplasm of digestive organs: Secondary | ICD-10-CM | POA: Diagnosis not present

## 2023-06-28 DIAGNOSIS — C50412 Malignant neoplasm of upper-outer quadrant of left female breast: Secondary | ICD-10-CM

## 2023-06-28 DIAGNOSIS — E559 Vitamin D deficiency, unspecified: Secondary | ICD-10-CM

## 2023-06-28 DIAGNOSIS — Z79899 Other long term (current) drug therapy: Secondary | ICD-10-CM | POA: Insufficient documentation

## 2023-06-28 DIAGNOSIS — Z780 Asymptomatic menopausal state: Secondary | ICD-10-CM

## 2023-06-28 DIAGNOSIS — Z801 Family history of malignant neoplasm of trachea, bronchus and lung: Secondary | ICD-10-CM | POA: Insufficient documentation

## 2023-06-28 DIAGNOSIS — C50812 Malignant neoplasm of overlapping sites of left female breast: Secondary | ICD-10-CM | POA: Diagnosis not present

## 2023-06-28 DIAGNOSIS — M8580 Other specified disorders of bone density and structure, unspecified site: Secondary | ICD-10-CM | POA: Insufficient documentation

## 2023-06-28 MED ORDER — ANASTROZOLE 1 MG PO TABS: 1.0000 mg | ORAL_TABLET | Freq: Every day | ORAL | 3 refills | Status: DC

## 2023-06-28 NOTE — Patient Instructions (Addendum)
[facility] Discharge Instructions   You were seen and examined today by Dr. Ellin Saba.  He reviewed the results of your biopsy of the tumor. There was no spread to the lymph nodes and the margins of the biopsy were negative. This cancer is estrogen-receptor positive, meaning the cancer you had feeds off of estrogen. We will prescribe an estrogen-blocking pill daily for 5 to 10 years.  He reviewed the results of your Oncotype Dx score, which was 15. This means you would not have any benefit of receiving chemotherapy. You do need radiation treatment in conjunction with taking the pill. We will refer you to radiation oncology in [place].   He reviewed the results of your bone density test done in July. It is showing osteopenia, which just means your bones are weakened. You should take calcium and Vitamin D daily. You can also do light weight bearing exercises (3-5 lb weights) to help strengthen your bones.   We will see you back in 3 months. We will repeat lab work prior to this visit.   Return as scheduled.      Thank you for choosing [facility] at [facility] to provide your oncology and hematology care.  To afford each patient quality time with our provider, please arrive at least 15 minutes before your scheduled appointment time.   If you have a lab appointment with the Cancer Center please come in thru the Main Entrance and check in at the main information desk.  You need to re-schedule your appointment should you arrive 10 or more minutes late.  We strive to give you quality time with our providers, and arriving late affects you and other patients whose appointments are after yours.  Also, if you no show three or more times for appointments you may be dismissed from the clinic at the providers discretion.     Again, thank you for choosing [facility].  Our hope is that these requests will decrease the amount of time  that you wait before being seen by our physicians.       _____________________________________________________________  Should you have questions after your visit to [facility], please contact our office at [phone] and follow the prompts.  Our office hours are 8:00 a.m. and 4:30 p.m. Monday - Friday.  Please note that voicemails left after 4:00 p.m. may not be returned until the following business day.  We are closed weekends and major holidays.  You do have access to a nurse 24-7, just call the main number to the clinic [phone] and do not press any options, hold on the line and a nurse will answer the phone.    For prescription refill requests, have your pharmacy contact our office and allow 72 hours.    Due to Covid, you will need to wear a mask upon entering the hospital. If you do not have a mask, a mask will be given to you at the Main Entrance upon arrival. For doctor visits, patients may have 1 support person age 49 or older with them. For treatment visits, patients can not have anyone with them due to social distancing guidelines and our immunocompromised population.

## 2023-07-17 ENCOUNTER — Encounter: Payer: Self-pay | Admitting: Hematology

## 2023-07-19 DIAGNOSIS — C50412 Malignant neoplasm of upper-outer quadrant of left female breast: Secondary | ICD-10-CM | POA: Diagnosis not present

## 2023-07-19 DIAGNOSIS — Z510 Encounter for antineoplastic radiation therapy: Secondary | ICD-10-CM | POA: Diagnosis not present

## 2023-07-19 DIAGNOSIS — Z170 Estrogen receptor positive status [ER+]: Secondary | ICD-10-CM | POA: Diagnosis not present

## 2023-07-19 DIAGNOSIS — Z3202 Encounter for pregnancy test, result negative: Secondary | ICD-10-CM | POA: Diagnosis not present

## 2023-07-19 DIAGNOSIS — C50912 Malignant neoplasm of unspecified site of left female breast: Secondary | ICD-10-CM | POA: Diagnosis not present

## 2023-07-20 DIAGNOSIS — C50412 Malignant neoplasm of upper-outer quadrant of left female breast: Secondary | ICD-10-CM | POA: Diagnosis not present

## 2023-07-20 DIAGNOSIS — Z3202 Encounter for pregnancy test, result negative: Secondary | ICD-10-CM | POA: Diagnosis not present

## 2023-07-20 DIAGNOSIS — C50912 Malignant neoplasm of unspecified site of left female breast: Secondary | ICD-10-CM | POA: Diagnosis not present

## 2023-07-20 DIAGNOSIS — Z170 Estrogen receptor positive status [ER+]: Secondary | ICD-10-CM | POA: Diagnosis not present

## 2023-07-20 DIAGNOSIS — Z510 Encounter for antineoplastic radiation therapy: Secondary | ICD-10-CM | POA: Diagnosis not present

## 2023-07-23 DIAGNOSIS — C50412 Malignant neoplasm of upper-outer quadrant of left female breast: Secondary | ICD-10-CM | POA: Insufficient documentation

## 2023-07-24 DIAGNOSIS — Z170 Estrogen receptor positive status [ER+]: Secondary | ICD-10-CM | POA: Diagnosis not present

## 2023-07-24 DIAGNOSIS — C50412 Malignant neoplasm of upper-outer quadrant of left female breast: Secondary | ICD-10-CM | POA: Diagnosis not present

## 2023-07-24 DIAGNOSIS — Z3202 Encounter for pregnancy test, result negative: Secondary | ICD-10-CM | POA: Diagnosis not present

## 2023-07-24 DIAGNOSIS — Z510 Encounter for antineoplastic radiation therapy: Secondary | ICD-10-CM | POA: Diagnosis not present

## 2023-07-24 DIAGNOSIS — C50912 Malignant neoplasm of unspecified site of left female breast: Secondary | ICD-10-CM | POA: Diagnosis not present

## 2023-08-01 DIAGNOSIS — C50412 Malignant neoplasm of upper-outer quadrant of left female breast: Secondary | ICD-10-CM | POA: Diagnosis not present

## 2023-08-01 DIAGNOSIS — Z510 Encounter for antineoplastic radiation therapy: Secondary | ICD-10-CM | POA: Diagnosis not present

## 2023-08-01 DIAGNOSIS — Z170 Estrogen receptor positive status [ER+]: Secondary | ICD-10-CM | POA: Diagnosis not present

## 2023-08-01 DIAGNOSIS — Z3202 Encounter for pregnancy test, result negative: Secondary | ICD-10-CM | POA: Diagnosis not present

## 2023-08-01 DIAGNOSIS — C50912 Malignant neoplasm of unspecified site of left female breast: Secondary | ICD-10-CM | POA: Diagnosis not present

## 2023-08-03 DIAGNOSIS — Z170 Estrogen receptor positive status [ER+]: Secondary | ICD-10-CM | POA: Diagnosis not present

## 2023-08-03 DIAGNOSIS — C50412 Malignant neoplasm of upper-outer quadrant of left female breast: Secondary | ICD-10-CM | POA: Diagnosis not present

## 2023-08-03 DIAGNOSIS — Z3202 Encounter for pregnancy test, result negative: Secondary | ICD-10-CM | POA: Diagnosis not present

## 2023-08-03 DIAGNOSIS — Z510 Encounter for antineoplastic radiation therapy: Secondary | ICD-10-CM | POA: Diagnosis not present

## 2023-08-03 DIAGNOSIS — C50912 Malignant neoplasm of unspecified site of left female breast: Secondary | ICD-10-CM | POA: Diagnosis not present

## 2023-08-06 DIAGNOSIS — Z510 Encounter for antineoplastic radiation therapy: Secondary | ICD-10-CM | POA: Diagnosis not present

## 2023-08-06 DIAGNOSIS — C50912 Malignant neoplasm of unspecified site of left female breast: Secondary | ICD-10-CM | POA: Diagnosis not present

## 2023-08-06 DIAGNOSIS — Z170 Estrogen receptor positive status [ER+]: Secondary | ICD-10-CM | POA: Diagnosis not present

## 2023-08-06 DIAGNOSIS — C50412 Malignant neoplasm of upper-outer quadrant of left female breast: Secondary | ICD-10-CM | POA: Diagnosis not present

## 2023-08-06 DIAGNOSIS — Z3202 Encounter for pregnancy test, result negative: Secondary | ICD-10-CM | POA: Diagnosis not present

## 2023-08-07 DIAGNOSIS — C50412 Malignant neoplasm of upper-outer quadrant of left female breast: Secondary | ICD-10-CM | POA: Diagnosis not present

## 2023-08-07 DIAGNOSIS — C50912 Malignant neoplasm of unspecified site of left female breast: Secondary | ICD-10-CM | POA: Diagnosis not present

## 2023-08-07 DIAGNOSIS — Z510 Encounter for antineoplastic radiation therapy: Secondary | ICD-10-CM | POA: Diagnosis not present

## 2023-08-07 DIAGNOSIS — Z170 Estrogen receptor positive status [ER+]: Secondary | ICD-10-CM | POA: Diagnosis not present

## 2023-08-07 DIAGNOSIS — Z3202 Encounter for pregnancy test, result negative: Secondary | ICD-10-CM | POA: Diagnosis not present

## 2023-08-08 DIAGNOSIS — Z170 Estrogen receptor positive status [ER+]: Secondary | ICD-10-CM | POA: Diagnosis not present

## 2023-08-08 DIAGNOSIS — C50412 Malignant neoplasm of upper-outer quadrant of left female breast: Secondary | ICD-10-CM | POA: Diagnosis not present

## 2023-08-08 DIAGNOSIS — Z3202 Encounter for pregnancy test, result negative: Secondary | ICD-10-CM | POA: Diagnosis not present

## 2023-08-08 DIAGNOSIS — C50912 Malignant neoplasm of unspecified site of left female breast: Secondary | ICD-10-CM | POA: Diagnosis not present

## 2023-08-08 DIAGNOSIS — Z510 Encounter for antineoplastic radiation therapy: Secondary | ICD-10-CM | POA: Diagnosis not present

## 2023-08-09 DIAGNOSIS — Z510 Encounter for antineoplastic radiation therapy: Secondary | ICD-10-CM | POA: Diagnosis not present

## 2023-08-09 DIAGNOSIS — Z170 Estrogen receptor positive status [ER+]: Secondary | ICD-10-CM | POA: Diagnosis not present

## 2023-08-09 DIAGNOSIS — Z3202 Encounter for pregnancy test, result negative: Secondary | ICD-10-CM | POA: Diagnosis not present

## 2023-08-09 DIAGNOSIS — C50412 Malignant neoplasm of upper-outer quadrant of left female breast: Secondary | ICD-10-CM | POA: Diagnosis not present

## 2023-08-09 DIAGNOSIS — C50912 Malignant neoplasm of unspecified site of left female breast: Secondary | ICD-10-CM | POA: Diagnosis not present

## 2023-08-10 DIAGNOSIS — C50912 Malignant neoplasm of unspecified site of left female breast: Secondary | ICD-10-CM | POA: Diagnosis not present

## 2023-08-10 DIAGNOSIS — Z510 Encounter for antineoplastic radiation therapy: Secondary | ICD-10-CM | POA: Diagnosis not present

## 2023-08-10 DIAGNOSIS — C50412 Malignant neoplasm of upper-outer quadrant of left female breast: Secondary | ICD-10-CM | POA: Diagnosis not present

## 2023-08-10 DIAGNOSIS — Z3202 Encounter for pregnancy test, result negative: Secondary | ICD-10-CM | POA: Diagnosis not present

## 2023-08-10 DIAGNOSIS — Z170 Estrogen receptor positive status [ER+]: Secondary | ICD-10-CM | POA: Diagnosis not present

## 2023-08-13 DIAGNOSIS — Z510 Encounter for antineoplastic radiation therapy: Secondary | ICD-10-CM | POA: Diagnosis not present

## 2023-08-13 DIAGNOSIS — Z170 Estrogen receptor positive status [ER+]: Secondary | ICD-10-CM | POA: Diagnosis not present

## 2023-08-13 DIAGNOSIS — Z3202 Encounter for pregnancy test, result negative: Secondary | ICD-10-CM | POA: Diagnosis not present

## 2023-08-13 DIAGNOSIS — C50412 Malignant neoplasm of upper-outer quadrant of left female breast: Secondary | ICD-10-CM | POA: Diagnosis not present

## 2023-08-13 DIAGNOSIS — C50912 Malignant neoplasm of unspecified site of left female breast: Secondary | ICD-10-CM | POA: Diagnosis not present

## 2023-08-14 DIAGNOSIS — C50412 Malignant neoplasm of upper-outer quadrant of left female breast: Secondary | ICD-10-CM | POA: Diagnosis not present

## 2023-08-14 DIAGNOSIS — Z510 Encounter for antineoplastic radiation therapy: Secondary | ICD-10-CM | POA: Diagnosis not present

## 2023-08-14 DIAGNOSIS — C50912 Malignant neoplasm of unspecified site of left female breast: Secondary | ICD-10-CM | POA: Diagnosis not present

## 2023-08-14 DIAGNOSIS — Z170 Estrogen receptor positive status [ER+]: Secondary | ICD-10-CM | POA: Diagnosis not present

## 2023-08-14 DIAGNOSIS — Z3202 Encounter for pregnancy test, result negative: Secondary | ICD-10-CM | POA: Diagnosis not present

## 2023-08-15 DIAGNOSIS — Z170 Estrogen receptor positive status [ER+]: Secondary | ICD-10-CM | POA: Diagnosis not present

## 2023-08-15 DIAGNOSIS — C50412 Malignant neoplasm of upper-outer quadrant of left female breast: Secondary | ICD-10-CM | POA: Diagnosis not present

## 2023-08-15 DIAGNOSIS — Z510 Encounter for antineoplastic radiation therapy: Secondary | ICD-10-CM | POA: Diagnosis not present

## 2023-08-15 DIAGNOSIS — C50912 Malignant neoplasm of unspecified site of left female breast: Secondary | ICD-10-CM | POA: Diagnosis not present

## 2023-08-15 DIAGNOSIS — Z3202 Encounter for pregnancy test, result negative: Secondary | ICD-10-CM | POA: Diagnosis not present

## 2023-08-16 DIAGNOSIS — Z170 Estrogen receptor positive status [ER+]: Secondary | ICD-10-CM | POA: Diagnosis not present

## 2023-08-16 DIAGNOSIS — C50412 Malignant neoplasm of upper-outer quadrant of left female breast: Secondary | ICD-10-CM | POA: Diagnosis not present

## 2023-08-16 DIAGNOSIS — Z3202 Encounter for pregnancy test, result negative: Secondary | ICD-10-CM | POA: Diagnosis not present

## 2023-08-16 DIAGNOSIS — C50912 Malignant neoplasm of unspecified site of left female breast: Secondary | ICD-10-CM | POA: Diagnosis not present

## 2023-08-16 DIAGNOSIS — Z510 Encounter for antineoplastic radiation therapy: Secondary | ICD-10-CM | POA: Diagnosis not present

## 2023-08-17 DIAGNOSIS — C50412 Malignant neoplasm of upper-outer quadrant of left female breast: Secondary | ICD-10-CM | POA: Diagnosis not present

## 2023-08-17 DIAGNOSIS — Z170 Estrogen receptor positive status [ER+]: Secondary | ICD-10-CM | POA: Diagnosis not present

## 2023-08-17 DIAGNOSIS — Z510 Encounter for antineoplastic radiation therapy: Secondary | ICD-10-CM | POA: Diagnosis not present

## 2023-08-17 DIAGNOSIS — C50912 Malignant neoplasm of unspecified site of left female breast: Secondary | ICD-10-CM | POA: Diagnosis not present

## 2023-08-17 DIAGNOSIS — Z3202 Encounter for pregnancy test, result negative: Secondary | ICD-10-CM | POA: Diagnosis not present

## 2023-08-20 DIAGNOSIS — C50912 Malignant neoplasm of unspecified site of left female breast: Secondary | ICD-10-CM | POA: Diagnosis not present

## 2023-08-20 DIAGNOSIS — C50412 Malignant neoplasm of upper-outer quadrant of left female breast: Secondary | ICD-10-CM | POA: Diagnosis not present

## 2023-08-20 DIAGNOSIS — Z3202 Encounter for pregnancy test, result negative: Secondary | ICD-10-CM | POA: Diagnosis not present

## 2023-08-20 DIAGNOSIS — Z510 Encounter for antineoplastic radiation therapy: Secondary | ICD-10-CM | POA: Diagnosis not present

## 2023-08-20 DIAGNOSIS — Z170 Estrogen receptor positive status [ER+]: Secondary | ICD-10-CM | POA: Diagnosis not present

## 2023-08-21 DIAGNOSIS — Z170 Estrogen receptor positive status [ER+]: Secondary | ICD-10-CM | POA: Diagnosis not present

## 2023-08-21 DIAGNOSIS — Z3202 Encounter for pregnancy test, result negative: Secondary | ICD-10-CM | POA: Diagnosis not present

## 2023-08-21 DIAGNOSIS — C50912 Malignant neoplasm of unspecified site of left female breast: Secondary | ICD-10-CM | POA: Diagnosis not present

## 2023-08-21 DIAGNOSIS — C50412 Malignant neoplasm of upper-outer quadrant of left female breast: Secondary | ICD-10-CM | POA: Diagnosis not present

## 2023-08-21 DIAGNOSIS — Z510 Encounter for antineoplastic radiation therapy: Secondary | ICD-10-CM | POA: Diagnosis not present

## 2023-08-22 DIAGNOSIS — C50912 Malignant neoplasm of unspecified site of left female breast: Secondary | ICD-10-CM | POA: Diagnosis not present

## 2023-08-22 DIAGNOSIS — Z510 Encounter for antineoplastic radiation therapy: Secondary | ICD-10-CM | POA: Diagnosis not present

## 2023-08-22 DIAGNOSIS — Z170 Estrogen receptor positive status [ER+]: Secondary | ICD-10-CM | POA: Diagnosis not present

## 2023-08-22 DIAGNOSIS — C50412 Malignant neoplasm of upper-outer quadrant of left female breast: Secondary | ICD-10-CM | POA: Diagnosis not present

## 2023-08-22 DIAGNOSIS — Z3202 Encounter for pregnancy test, result negative: Secondary | ICD-10-CM | POA: Diagnosis not present

## 2023-08-23 DIAGNOSIS — C50912 Malignant neoplasm of unspecified site of left female breast: Secondary | ICD-10-CM | POA: Diagnosis not present

## 2023-08-23 DIAGNOSIS — Z170 Estrogen receptor positive status [ER+]: Secondary | ICD-10-CM | POA: Diagnosis not present

## 2023-08-23 DIAGNOSIS — C50412 Malignant neoplasm of upper-outer quadrant of left female breast: Secondary | ICD-10-CM | POA: Diagnosis not present

## 2023-08-23 DIAGNOSIS — Z510 Encounter for antineoplastic radiation therapy: Secondary | ICD-10-CM | POA: Diagnosis not present

## 2023-08-23 DIAGNOSIS — Z3202 Encounter for pregnancy test, result negative: Secondary | ICD-10-CM | POA: Diagnosis not present

## 2023-08-24 DIAGNOSIS — Z3202 Encounter for pregnancy test, result negative: Secondary | ICD-10-CM | POA: Diagnosis not present

## 2023-08-24 DIAGNOSIS — C50412 Malignant neoplasm of upper-outer quadrant of left female breast: Secondary | ICD-10-CM | POA: Diagnosis not present

## 2023-08-24 DIAGNOSIS — C50912 Malignant neoplasm of unspecified site of left female breast: Secondary | ICD-10-CM | POA: Diagnosis not present

## 2023-08-24 DIAGNOSIS — Z510 Encounter for antineoplastic radiation therapy: Secondary | ICD-10-CM | POA: Diagnosis not present

## 2023-08-24 DIAGNOSIS — Z170 Estrogen receptor positive status [ER+]: Secondary | ICD-10-CM | POA: Diagnosis not present

## 2023-08-27 DIAGNOSIS — Z3202 Encounter for pregnancy test, result negative: Secondary | ICD-10-CM | POA: Diagnosis not present

## 2023-08-27 DIAGNOSIS — Z170 Estrogen receptor positive status [ER+]: Secondary | ICD-10-CM | POA: Diagnosis not present

## 2023-08-27 DIAGNOSIS — C50912 Malignant neoplasm of unspecified site of left female breast: Secondary | ICD-10-CM | POA: Diagnosis not present

## 2023-08-27 DIAGNOSIS — Z510 Encounter for antineoplastic radiation therapy: Secondary | ICD-10-CM | POA: Diagnosis not present

## 2023-08-27 DIAGNOSIS — C50412 Malignant neoplasm of upper-outer quadrant of left female breast: Secondary | ICD-10-CM | POA: Diagnosis not present

## 2023-08-28 DIAGNOSIS — Z510 Encounter for antineoplastic radiation therapy: Secondary | ICD-10-CM | POA: Diagnosis not present

## 2023-08-28 DIAGNOSIS — C50412 Malignant neoplasm of upper-outer quadrant of left female breast: Secondary | ICD-10-CM | POA: Diagnosis not present

## 2023-08-28 DIAGNOSIS — Z3202 Encounter for pregnancy test, result negative: Secondary | ICD-10-CM | POA: Diagnosis not present

## 2023-08-28 DIAGNOSIS — Z170 Estrogen receptor positive status [ER+]: Secondary | ICD-10-CM | POA: Diagnosis not present

## 2023-08-28 DIAGNOSIS — C50912 Malignant neoplasm of unspecified site of left female breast: Secondary | ICD-10-CM | POA: Diagnosis not present

## 2023-08-29 DIAGNOSIS — Z510 Encounter for antineoplastic radiation therapy: Secondary | ICD-10-CM | POA: Diagnosis not present

## 2023-08-29 DIAGNOSIS — Z170 Estrogen receptor positive status [ER+]: Secondary | ICD-10-CM | POA: Diagnosis not present

## 2023-08-29 DIAGNOSIS — Z3202 Encounter for pregnancy test, result negative: Secondary | ICD-10-CM | POA: Diagnosis not present

## 2023-08-29 DIAGNOSIS — C50412 Malignant neoplasm of upper-outer quadrant of left female breast: Secondary | ICD-10-CM | POA: Diagnosis not present

## 2023-08-29 DIAGNOSIS — C50912 Malignant neoplasm of unspecified site of left female breast: Secondary | ICD-10-CM | POA: Diagnosis not present

## 2023-08-30 DIAGNOSIS — Z170 Estrogen receptor positive status [ER+]: Secondary | ICD-10-CM | POA: Diagnosis not present

## 2023-08-30 DIAGNOSIS — C50412 Malignant neoplasm of upper-outer quadrant of left female breast: Secondary | ICD-10-CM | POA: Diagnosis not present

## 2023-08-30 DIAGNOSIS — C50912 Malignant neoplasm of unspecified site of left female breast: Secondary | ICD-10-CM | POA: Diagnosis not present

## 2023-08-30 DIAGNOSIS — Z3202 Encounter for pregnancy test, result negative: Secondary | ICD-10-CM | POA: Diagnosis not present

## 2023-08-30 DIAGNOSIS — Z510 Encounter for antineoplastic radiation therapy: Secondary | ICD-10-CM | POA: Diagnosis not present

## 2023-09-05 DIAGNOSIS — Z20822 Contact with and (suspected) exposure to covid-19: Secondary | ICD-10-CM | POA: Diagnosis not present

## 2023-09-05 DIAGNOSIS — J0100 Acute maxillary sinusitis, unspecified: Secondary | ICD-10-CM | POA: Diagnosis not present

## 2023-09-05 DIAGNOSIS — J209 Acute bronchitis, unspecified: Secondary | ICD-10-CM | POA: Diagnosis not present

## 2023-09-24 ENCOUNTER — Inpatient Hospital Stay: Payer: Medicaid Other | Attending: General Surgery

## 2023-09-24 DIAGNOSIS — Z79899 Other long term (current) drug therapy: Secondary | ICD-10-CM | POA: Insufficient documentation

## 2023-09-24 DIAGNOSIS — E559 Vitamin D deficiency, unspecified: Secondary | ICD-10-CM

## 2023-09-24 DIAGNOSIS — Z170 Estrogen receptor positive status [ER+]: Secondary | ICD-10-CM | POA: Diagnosis not present

## 2023-09-24 DIAGNOSIS — C50812 Malignant neoplasm of overlapping sites of left female breast: Secondary | ICD-10-CM | POA: Insufficient documentation

## 2023-09-24 DIAGNOSIS — R232 Flushing: Secondary | ICD-10-CM | POA: Insufficient documentation

## 2023-09-24 DIAGNOSIS — Z800 Family history of malignant neoplasm of digestive organs: Secondary | ICD-10-CM | POA: Diagnosis not present

## 2023-09-24 DIAGNOSIS — Z801 Family history of malignant neoplasm of trachea, bronchus and lung: Secondary | ICD-10-CM | POA: Diagnosis not present

## 2023-09-24 DIAGNOSIS — Z79811 Long term (current) use of aromatase inhibitors: Secondary | ICD-10-CM | POA: Diagnosis not present

## 2023-09-24 DIAGNOSIS — M8580 Other specified disorders of bone density and structure, unspecified site: Secondary | ICD-10-CM | POA: Diagnosis not present

## 2023-09-24 LAB — COMPREHENSIVE METABOLIC PANEL
ALT: 40 U/L (ref 0–44)
AST: 29 U/L (ref 15–41)
Albumin: 4.3 g/dL (ref 3.5–5.0)
Alkaline Phosphatase: 68 U/L (ref 38–126)
Anion gap: 10 (ref 5–15)
BUN: 12 mg/dL (ref 6–20)
CO2: 28 mmol/L (ref 22–32)
Calcium: 9.8 mg/dL (ref 8.9–10.3)
Chloride: 104 mmol/L (ref 98–111)
Creatinine, Ser: 1.1 mg/dL — ABNORMAL HIGH (ref 0.44–1.00)
GFR, Estimated: >60 mL/min (ref >60)
Glucose, Bld: 108 mg/dL — ABNORMAL HIGH (ref 70–99)
Potassium: 3.7 mmol/L (ref 3.5–5.1)
Sodium: 142 mmol/L (ref 135–145)
Total Bilirubin: 0.5 mg/dL (ref <1.2)
Total Protein: 7.5 g/dL (ref 6.5–8.1)

## 2023-09-24 LAB — CBC WITH DIFFERENTIAL/PLATELET
Abs Immature Granulocytes: 0.01 10*3/uL (ref 0.00–0.07)
Basophils Absolute: 0 10*3/uL (ref 0.0–0.1)
Basophils Relative: 0 %
Eosinophils Absolute: 0.1 10*3/uL (ref 0.0–0.5)
Eosinophils Relative: 2 %
HCT: 40.4 % (ref 36.0–46.0)
Hemoglobin: 13.7 g/dL (ref 12.0–15.0)
Immature Granulocytes: 0 %
Lymphocytes Relative: 29 %
Lymphs Abs: 1.8 10*3/uL (ref 0.7–4.0)
MCH: 32.6 pg (ref 26.0–34.0)
MCHC: 33.9 g/dL (ref 30.0–36.0)
MCV: 96.2 fL (ref 80.0–100.0)
Monocytes Absolute: 0.6 10*3/uL (ref 0.1–1.0)
Monocytes Relative: 10 %
Neutro Abs: 3.6 10*3/uL (ref 1.7–7.7)
Neutrophils Relative %: 59 %
Platelets: 178 10*3/uL (ref 150–400)
RBC: 4.2 MIL/uL (ref 3.87–5.11)
RDW: 13 % (ref 11.5–15.5)
WBC: 6.2 10*3/uL (ref 4.0–10.5)
nRBC: 0 % (ref 0.0–0.2)

## 2023-09-24 LAB — VITAMIN D 25 HYDROXY (VIT D DEFICIENCY, FRACTURES): Vit D, 25-Hydroxy: 45.51 ng/mL (ref 30–100)

## 2023-09-26 DIAGNOSIS — Z170 Estrogen receptor positive status [ER+]: Secondary | ICD-10-CM | POA: Diagnosis not present

## 2023-09-26 DIAGNOSIS — C50912 Malignant neoplasm of unspecified site of left female breast: Secondary | ICD-10-CM | POA: Diagnosis not present

## 2023-09-30 NOTE — Progress Notes (Signed)
[facility] [address], [place] [address]    Clinic Day:  10/01/2023  Referring physician: No ref. provider found  Patient Care Team: Pcp, No as PCP - General [name], MD as Medical Oncologist (Medical Oncology) Therese Sarah, RN as Oncology Nurse Navigator (Medical Oncology)   ASSESSMENT & PLAN:   Assessment: 1.  Stage I (T1a N0 G1 ER/PR+, HER2-) left breast IDC: - Screening mammogram (03/30/2023): Possible asymmetry in the left breast. - Diagnostic mammogram (04/12/2023): Hypoechoic mass with margin irregularity in the left breast at 3:00 3 cm from nipple measuring 0.5 x 0.3 x 0.4 cm.  No lymphadenopathy in the axilla. 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  0/2 sentinel lymph nodes positive.  pT1b pN0. - Oncotype DX RS-15.  Distant recurrence risk at 9 years with AI/tamoxifen-4%.  Absolute chemotherapy benefit less than 1%. - Germline mutation testing: Negative. - Anastrozole started on 06/28/2023   2.  Social/family history: - Lives with boyfriend at home and is independent of ADLs and IADLs.  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  Non-smoker. - Father died of lung cancer. - Maternal grandmother had colon cancer.  Maternal aunt had melanoma.    Plan: 1.  Stage I (T1a N0 G1 ER/PR+, HER2-) left breast IDC: - She reports that she is tolerating anastrozole reasonably well.  Occasional hot flashes present but denied any musculoskeletal symptoms. - XRT to the left breast completed about 4 weeks ago. - Recommend continuing anastrozole.  RTC 6 months for follow-up after mammogram in June of next year.   2.  Osteopenia (DEXA scan 05/10/2023 T-score  -1.9): - Continue calcium and vitamin D supplements.  Vitamin D is 45.  Plan to repeat DEXA scan in 2 years.    Orders Placed This Encounter  Procedures   MM DIAG BREAST TOMO BILATERAL    Standing Status:   Future    Expected Date:   03/31/2024    Expiration Date:   09/30/2024    Reason for Exam (SYMPTOM  OR DIAGNOSIS REQUIRED):   breast cancer screening    Is the patient pregnant?:   No    Preferred imaging location?:   [facility]   CBC with Differential    Standing Status:   Future    Expected Date:   03/31/2024    Expiration Date:   09/30/2024   Comprehensive metabolic panel    Standing Status:   Future    Expected Date:   03/31/2024    Expiration Date:   09/30/2024      I,Katie Daubenspeck,acting as a scribe for [name], MD.,have documented all relevant documentation on the behalf of [name], MD,as directed by  [name], MD while in the presence of [name], MD.   I, [name] MD, have reviewed the above documentation for accuracy and completeness, and I agree with the above.   [name], MD   12/16/20245:04 PM  CHIEF COMPLAINT:   Diagnosis: left breast cancer    Cancer Staging  Breast cancer of upper-outer quadrant of left female breast [facility]) Staging form: Breast, AJCC 8th Edition - Clinical stage from 05/02/2023: Stage IA (cT1b, cN0(sn), cM0, G1, ER+, PR+, HER2-) - Signed by  [name], MD on 06/28/2023    Prior Therapy: 1. Left lumpectomy, 05/28/23 2. XRT to left breast, 08/03/23 - 08/30/23  Current Therapy:  anastrozole   HISTORY OF PRESENT ILLNESS:   Oncology History  Breast cancer of upper-outer quadrant of left female breast (HCC)  05/02/2023 Initial Diagnosis   Breast cancer of upper-outer quadrant of left female breast (HCC)   05/02/2023 Cancer Staging   Staging form: Breast, AJCC 8th Edition - Clinical stage from 05/02/2023: Stage IA (cT1b, cN0(sn), cM0, G1, ER+, PR+,  HER2-) - Signed by [name], MD on 06/28/2023 Histopathologic type: Infiltrating duct carcinoma, NOS Stage prefix: Initial diagnosis Method of lymph node assessment: Sentinel lymph node biopsy Nuclear grade: G1 Histologic grading system: 3 grade system      INTERVAL HISTORY:   Lindsay Peters is a 47 y.o. female presenting to clinic today for follow up of left breast cancer. She was last seen by me on 06/28/23.  Today, she states that she is doing well overall. Her appetite level is at 100%. Her energy level is at 100%.  PAST MEDICAL HISTORY:   Past Medical History: No past medical history on file.  Surgical History: 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, grade 1 [place] LT BREAST BX W LOC DEV 1ST LESION IMG BX SPEC US GUIDE 04/17/2023 Edwin Cap, MD [facility]   NO PAST SURGERIES     PARTIAL MASTECTOMY WITH AXILLARY SENTINEL LYMPH NODE BIOPSY Left 05/28/2023   Procedure: PARTIAL MASTECTOMY WITH AXILLARY SENTINEL LYMPH NODE BIOPSY AND WITH RADIOFREQUENCY TAG;  Surgeon: [name], MD;  Location: [facility];  Service: General;  Laterality: Left;    Social History: Social History   Socioeconomic History   Marital status: Single    Spouse name: Not on file   Number of children: 0   Years of education: Not on file   Highest education level: High school graduate  Occupational History   Not on file  Tobacco Use   Smoking status: Never   Smokeless tobacco: Never  Vaping Use   Vaping status: Never Used  Substance and Sexual Activity   Alcohol use: Never   Drug use: Never   Sexual activity: Not Currently  Other Topics Concern   Not on file  Social History Narrative   Not on file   Social Drivers of Health   Financial Resource Strain: Not on file  Food Insecurity: No Food Insecurity (03/30/2023)   Hunger Vital Sign    Worried About  Running Out of Food in the Last Year: Never true    Ran Out of Food in the Last Year: Never true  Transportation Needs: No Transportation Needs (03/30/2023)   PRAPARE - Administrator, Civil Service (Medical): No    Lack of Transportation (Non-Medical): No  Physical Activity: Not on file  Stress: Not on file  Social Connections: Not on file  Intimate Partner Violence: Not At Risk (05/02/2023)   Humiliation, Afraid, Rape, and Kick questionnaire    Fear of Current or Ex-Partner: No    Emotionally Abused: No    Physically Abused: No    Sexually Abused: No    Family History: Family History  Problem Relation Age of Onset   Diverticulosis Mother    Lung cancer Father    Melanoma Maternal Aunt        died in 10/19/1999   Diabetes Maternal Grandmother  Colon cancer Maternal Grandmother        died 10-15-2007    Current Medications:  Current Outpatient Medications:    anastrozole (ARIMIDEX) 1 MG tablet, Take 1 tablet (1 mg total) by mouth daily., Disp: 90 tablet, Rfl: 3   Ascorbic Acid (VITAMIN C) 1000 MG tablet, Take 1,000 mg by mouth daily., Disp: , Rfl:    Calcium Carb-Cholecalciferol (CALCIUM + VITAMIN D3) 600-10 MG-MCG TABS, Take 1 tablet by mouth daily., Disp: , Rfl:    Cyanocobalamin 2500 MCG TABS, Take 2,500 mcg by mouth daily., Disp: , Rfl:    Multiple Vitamin (MULTIVITAMIN) capsule, Take 1 capsule by mouth daily., Disp: , Rfl:    Allergies: Allergies  Allergen Reactions   Sulfa Antibiotics Rash    REVIEW OF SYSTEMS:   Review of Systems  Constitutional:  Negative for chills, fatigue and fever.  HENT:   Negative for lump/mass, mouth sores, nosebleeds, sore throat and trouble swallowing.   Eyes:  Negative for eye problems.  Respiratory:  Negative for cough and shortness of breath.   Cardiovascular:  Negative for chest pain, leg swelling and palpitations.  Gastrointestinal:  Negative for abdominal pain, constipation, diarrhea, nausea and vomiting.  Genitourinary:   Negative for bladder incontinence, difficulty urinating, dysuria, frequency, hematuria and nocturia.   Musculoskeletal:  Negative for arthralgias, back pain, flank pain, myalgias and neck pain.  Skin:  Negative for itching and rash.  Neurological:  Negative for dizziness, headaches and numbness.  Hematological:  Does not bruise/bleed easily.  Psychiatric/Behavioral:  Negative for depression, sleep disturbance and suicidal ideas. The patient is not nervous/anxious.   All other systems reviewed and are negative.    VITALS:   Blood pressure 126/83, pulse 70, temperature 98.2 F (36.8 C), temperature source Oral, resp. rate 16, weight 143 lb 9.6 oz (65.1 kg), SpO2 100%.  Wt Readings from Last 3 Encounters:  10/01/23 143 lb 9.6 oz (65.1 kg)  06/28/23 142 lb 12.8 oz (64.8 kg)  06/05/23 142 lb (64.4 kg)    Body mass index is 24.65 kg/m.  Performance status (ECOG): 1 - Symptomatic but completely ambulatory  PHYSICAL EXAM:   Physical Exam Vitals and nursing note reviewed. Exam conducted with a chaperone present.  Constitutional:      Appearance: Normal appearance.  Cardiovascular:     Rate and Rhythm: Normal rate and regular rhythm.     Pulses: Normal pulses.     Heart sounds: Normal heart sounds.  Pulmonary:     Effort: Pulmonary effort is normal.     Breath sounds: Normal breath sounds.  Abdominal:     Palpations: Abdomen is soft. There is no hepatomegaly, splenomegaly or mass.     Tenderness: There is no abdominal tenderness.  Musculoskeletal:     Right lower leg: No edema.     Left lower leg: No edema.  Lymphadenopathy:     Cervical: No cervical adenopathy.     Right cervical: No superficial, deep or posterior cervical adenopathy.    Left cervical: No superficial, deep or posterior cervical adenopathy.     Upper Body:     Right upper body: No supraclavicular or axillary adenopathy.     Left upper body: No supraclavicular or axillary adenopathy.  Neurological:     General:  No focal deficit present.     Mental Status: She is alert and oriented to person, place, and time.  Psychiatric:        Mood and Affect: Mood normal.  Behavior: Behavior normal.     LABS:      Latest Ref Rng & Units 09/24/2023   10:22 AM 05/24/2023    8:38 AM 05/02/2023    2:29 PM  CBC  WBC 4.0 - 10.5 K/uL 6.2  7.0  8.6   Hemoglobin 12.0 - 15.0 g/dL 16.1  09.6  04.5   Hematocrit 36.0 - 46.0 % 40.4  39.6  40.3   Platelets 150 - 400 K/uL 178  165  191       Latest Ref Rng & Units 09/24/2023   10:22 AM 05/24/2023    8:38 AM 05/02/2023    2:29 PM  CMP  Glucose 70 - 99 mg/dL 409  811  914   BUN 6 - 20 mg/dL 12  12  14    Creatinine 0.44 - 1.00 mg/dL 7.82  9.56  2.13   Sodium 135 - 145 mmol/L 142  139  137   Potassium 3.5 - 5.1 mmol/L 3.7  3.8  3.4   Chloride 98 - 111 mmol/L 104  106  101   CO2 22 - 32 mmol/L 28  24  27    Calcium 8.9 - 10.3 mg/dL 9.8  9.3  9.8   Total Protein 6.5 - 8.1 g/dL 7.5  7.2  7.5   Total Bilirubin <1.2 mg/dL 0.5  0.3  0.5   Alkaline Phos 38 - 126 U/L 68  71  77   AST 15 - 41 U/L 29  26  27    ALT 0 - 44 U/L 40  40  42      No results found for: "CEA1", "CEA" / No results found for: "CEA1", "CEA" No results found for: "PSA1" No results found for: "YQM578" No results found for: "CAN125"  No results found for: "TOTALPROTELP", "ALBUMINELP", "A1GS", "A2GS", "BETS", "BETA2SER", "GAMS", "MSPIKE", "SPEI" No results found for: "TIBC", "FERRITIN", "IRONPCTSAT" No results found for: "LDH"   STUDIES:   No results found.

## 2023-10-01 ENCOUNTER — Other Ambulatory Visit (HOSPITAL_COMMUNITY): Payer: Self-pay | Admitting: Hematology

## 2023-10-01 ENCOUNTER — Inpatient Hospital Stay (HOSPITAL_BASED_OUTPATIENT_CLINIC_OR_DEPARTMENT_OTHER): Payer: Medicaid Other | Admitting: Hematology

## 2023-10-01 VITALS — BP 126/83 | HR 70 | Temp 98.2°F | Resp 16 | Wt 143.6 lb

## 2023-10-01 DIAGNOSIS — Z9889 Other specified postprocedural states: Secondary | ICD-10-CM

## 2023-10-01 DIAGNOSIS — M8580 Other specified disorders of bone density and structure, unspecified site: Secondary | ICD-10-CM | POA: Diagnosis not present

## 2023-10-01 DIAGNOSIS — C50812 Malignant neoplasm of overlapping sites of left female breast: Secondary | ICD-10-CM | POA: Diagnosis not present

## 2023-10-01 DIAGNOSIS — Z79899 Other long term (current) drug therapy: Secondary | ICD-10-CM | POA: Diagnosis not present

## 2023-10-01 DIAGNOSIS — Z170 Estrogen receptor positive status [ER+]: Secondary | ICD-10-CM | POA: Diagnosis not present

## 2023-10-01 DIAGNOSIS — Z800 Family history of malignant neoplasm of digestive organs: Secondary | ICD-10-CM | POA: Diagnosis not present

## 2023-10-01 DIAGNOSIS — Z79811 Long term (current) use of aromatase inhibitors: Secondary | ICD-10-CM | POA: Diagnosis not present

## 2023-10-01 DIAGNOSIS — R232 Flushing: Secondary | ICD-10-CM | POA: Diagnosis not present

## 2023-10-01 DIAGNOSIS — C50412 Malignant neoplasm of upper-outer quadrant of left female breast: Secondary | ICD-10-CM

## 2023-10-01 DIAGNOSIS — Z801 Family history of malignant neoplasm of trachea, bronchus and lung: Secondary | ICD-10-CM | POA: Diagnosis not present

## 2023-10-01 NOTE — Patient Instructions (Signed)
[facility] at [facility] Discharge Instructions   You were seen and examined today by Dr. Ellin Saba.  He reviewed the results of your lab work which are normal/stable.   Continue anastrozole as prescribed.   Return as scheduled.    Thank you for choosing [facility] at [facility] to provide your oncology and hematology care.  To afford each patient quality time with our provider, please arrive at least 15 minutes before your scheduled appointment time.   If you have a lab appointment with the Cancer Center please come in thru the Main Entrance and check in at the main information desk.  You need to re-schedule your appointment should you arrive 10 or more minutes late.  We strive to give you quality time with our providers, and arriving late affects you and other patients whose appointments are after yours.  Also, if you no show three or more times for appointments you may be dismissed from the clinic at the providers discretion.     Again, thank you for choosing [facility].  Our hope is that these requests will decrease the amount of time that you wait before being seen by our physicians.       _____________________________________________________________  Should you have questions after your visit to [facility], please contact our office at [phone] and follow the prompts.  Our office hours are 8:00 a.m. and 4:30 p.m. Monday - Friday.  Please note that voicemails left after 4:00 p.m. may not be returned until the following business day.  We are closed weekends and major holidays.  You do have access to a nurse 24-7, just call the main number to the clinic [phone] and do not press any options, hold on the line and a nurse will answer the phone.    For prescription refill requests, have your pharmacy contact our office and allow 72 hours.    Due to Covid, you will need to wear a mask upon entering the  hospital. If you do not have a mask, a mask will be given to you at the Main Entrance upon arrival. For doctor visits, patients may have 1 support person age 20 or older with them. For treatment visits, patients can not have anyone with them due to social distancing guidelines and our immunocompromised population.

## 2024-01-10 NOTE — Progress Notes (Signed)
 New Patient Office Visit  Subjective   Patient ID: Lindsay Peters, female    DOB: [birth year]  Age: 48 y.o. MRN: [id number]  CC:  Chief Complaint  Patient presents with   Establish Care    HPI Lindsay Peters  a 48 year old female presents on January 14, 2024, to establish care. She reports no current concerns. Her medical history is significant for breast cancer in the left breast, for which she is currently undergoing radiation treatment. She denies any acute symptoms such as pain, fatigue, skin changes beyond expected radiation effects, or systemic symptoms like fever or weight loss. She has no new complaints and is seeking routine follow-up and continuity of care. Reports family history of diabetes mother.      01/14/2024    2:30 PM  GAD 7 : Generalized Anxiety Score  Nervous, Anxious, on Edge 0  Control/stop worrying 0  Worry too much - different things 0  Trouble relaxing 0  Restless 0  Easily annoyed or irritable 0  Afraid - awful might happen 0  Total GAD 7 Score 0  Anxiety Difficulty Not difficult at all        01/14/2024    2:29 PM 05/02/2023    1:44 PM  PHQ9 SCORE ONLY  PHQ-9 Total Score 0 0     Outpatient Encounter Medications as of 01/14/2024  Medication Sig   anastrozole (ARIMIDEX) 1 MG tablet Take 1 tablet (1 mg total) by mouth daily.   Ascorbic Acid (VITAMIN C) 1000 MG tablet Take 1,000 mg by mouth daily.   Calcium Carb-Cholecalciferol (CALCIUM + VITAMIN D3) 600-10 MG-MCG TABS Take 1 tablet by mouth daily.   Cyanocobalamin 2500 MCG TABS Take 2,500 mcg by mouth daily.   Multiple Vitamin (MULTIVITAMIN) capsule Take 1 capsule by mouth daily.   No facility-administered encounter medications on file as of 01/14/2024.    History reviewed. No pertinent past medical history.  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, grade 1 [place] LT  BREAST BX W LOC DEV 1ST LESION IMG BX SPEC US GUIDE 04/17/2023 Edwin Cap, MD [facility]   NO PAST SURGERIES     PARTIAL MASTECTOMY WITH AXILLARY SENTINEL LYMPH NODE BIOPSY Left 05/28/2023   Procedure: PARTIAL MASTECTOMY WITH AXILLARY SENTINEL LYMPH NODE BIOPSY AND WITH RADIOFREQUENCY TAG;  Surgeon: [name], MD;  Location: [facility];  Service: General;  Laterality: Left;    Family History  Problem Relation Age of Onset   Diverticulosis Mother    Lung cancer Father    Melanoma Maternal Aunt        died in 02/15/1999   Diabetes Maternal Grandmother    Colon cancer Maternal Grandmother        died 15-Feb-2007    Social History   Socioeconomic History   Marital status: Single    Spouse name: Not on file   Number of children: 0   Years of education: Not on file   Highest education level: High school graduate  Occupational History   Not on file  Tobacco Use   Smoking status: Never   Smokeless tobacco: Never  Vaping Use   Vaping status: Never Used  Substance and Sexual Activity   Alcohol use: Never   Drug use: Never   Sexual activity: Not Currently  Other Topics Concern   Not on file  Social History Narrative   Not on file   Social  Drivers of Health   Financial Resource Strain: Low Risk  (01/14/2024)   Overall Financial Resource Strain (CARDIA)    Difficulty of Paying Living Expenses: Not hard at all  Food Insecurity: No Food Insecurity (03/30/2023)   Hunger Vital Sign    Worried About Running Out of Food in the Last Year: Never true    Ran Out of Food in the Last Year: Never true  Transportation Needs: No Transportation Needs (03/30/2023)   PRAPARE - Administrator, Civil Service (Medical): No    Lack of Transportation (Non-Medical): No  Physical Activity: Inactive (01/14/2024)   Exercise Vital Sign    Days of Exercise per Week: 0 days    Minutes of Exercise per Session: 0 min  Stress: Not on file  Social Connections: Not on file  Intimate Partner Violence:  Not At Risk (05/02/2023)   Humiliation, Afraid, Rape, and Kick questionnaire    Fear of Current or Ex-Partner: No    Emotionally Abused: No    Physically Abused: No    Sexually Abused: No    Review of Systems  Constitutional:  Negative for chills and fever.  HENT:  Negative for congestion, nosebleeds and sore throat.   Respiratory:  Negative for cough, sputum production and shortness of breath.   Cardiovascular:  Negative for chest pain and leg swelling.  Gastrointestinal:  Negative for nausea and vomiting.  Skin:  Negative for itching and rash.  Neurological:  Negative for dizziness and headaches.  Psychiatric/Behavioral:  The patient does not have insomnia.    Negative unless indicated in HPI    Objective   BP 117/73   Pulse 75   Temp (!) 97.4 F (36.3 C) (Temporal)   Ht 5\' 4"  (1.626 m)   Wt 141 lb 6.4 oz (64.1 kg)   SpO2 95%   BMI 24.27 kg/m   Physical Exam Vitals and nursing note reviewed.  Constitutional:      General: She is not in acute distress.    Appearance: Normal appearance.  HENT:     Head: Normocephalic and atraumatic.     Right Ear: Tympanic membrane, ear canal and external ear normal. There is no impacted cerumen.     Left Ear: Tympanic membrane, ear canal and external ear normal.     Nose: Nose normal.     Mouth/Throat:     Mouth: Mucous membranes are moist.  Eyes:     General: No scleral icterus.    Extraocular Movements: Extraocular movements intact.     Conjunctiva/sclera: Conjunctivae normal.     Pupils: Pupils are equal, round, and reactive to light.  Neck:     Vascular: No carotid bruit.  Cardiovascular:     Heart sounds: Normal heart sounds.  Pulmonary:     Effort: Pulmonary effort is normal.     Breath sounds: Normal breath sounds.  Abdominal:     General: Bowel sounds are normal.     Palpations: Abdomen is soft.     Tenderness: There is no right CVA tenderness or left CVA tenderness.  Musculoskeletal:        General: Normal range  of motion.     Cervical back: Normal range of motion and neck supple. No rigidity or tenderness.     Right lower leg: No edema.     Left lower leg: No edema.  Lymphadenopathy:     Cervical: No cervical adenopathy.  Skin:    General: Skin is warm and dry.  Capillary Refill: Capillary refill takes less than 2 seconds.     Findings: No rash.  Neurological:     Mental Status: She is alert and oriented to person, place, and time. Mental status is at baseline.  Psychiatric:        Mood and Affect: Mood normal.        Behavior: Behavior normal.        Thought Content: Thought content normal.        Judgment: Judgment normal.     Last CBC Lab Results  Component Value Date   WBC 6.2 09/24/2023   HGB 13.7 09/24/2023   HCT 40.4 09/24/2023   MCV 96.2 09/24/2023   MCH 32.6 09/24/2023   RDW 13.0 09/24/2023   PLT 178 09/24/2023   Last metabolic panel Lab Results  Component Value Date   GLUCOSE 108 (H) 09/24/2023   NA 142 09/24/2023   K 3.7 09/24/2023   CL 104 09/24/2023   CO2 28 09/24/2023   BUN 12 09/24/2023   CREATININE 1.10 (H) 09/24/2023   GFRNONAA >60 09/24/2023   CALCIUM 9.8 09/24/2023   PROT 7.5 09/24/2023   ALBUMIN 4.3 09/24/2023   BILITOT 0.5 09/24/2023   ALKPHOS 68 09/24/2023   AST 29 09/24/2023   ALT 40 09/24/2023   ANIONGAP 10 09/24/2023     Assessment & Plan:  Screening for colon cancer -     Cologuard  Encounter for general adult medical examination with abnormal findings -     CBC with Differential/Platelet -     Comprehensive metabolic panel with GFR -     Lipid panel -     Thyroid Profile -     HepB+HepC+HIV Panel  Family history of diabetes mellitus in mother  Lindsay Peters is a 65 yrs old Caucasian female seen to to establish care, no acute distress Labs: CBC, CMP, lipid, TSH, HIV, hep C results pending Health maintenance: Cologuard ordered client understand once the sample is received she needs to mail it out within 72 hours Plan to continue all  previously prescribed medications and vitamins Continue healthy lifestyle choices, including diet (rich in fruits, vegetables, and lean proteins, and low in salt and simple carbohydrates) and exercise (at least 30 minutes of moderate physical activity daily).     The above assessment and management plan was discussed with the patient. The patient verbalized understanding of and has agreed to the management plan. Patient is aware to call the clinic if they develop any new symptoms or if symptoms persist or worsen. Patient is aware when to return to the clinic for a follow-up visit. Patient educated on when it is appropriate to go to the emergency department.  Return in about 6 weeks (around 02/25/2024) for physical.   Martina Sinner, DNP [facility] [address] [place], [place] [address] [phone]  Note: This document was prepared by [facility] voice dictation technology and any errors that results from this process are unintentional.

## 2024-01-14 ENCOUNTER — Ambulatory Visit (INDEPENDENT_AMBULATORY_CARE_PROVIDER_SITE_OTHER): Admitting: Nurse Practitioner

## 2024-01-14 ENCOUNTER — Encounter: Payer: Self-pay | Admitting: Nurse Practitioner

## 2024-01-14 VITALS — BP 117/73 | HR 75 | Temp 97.4°F | Ht 64.0 in | Wt 141.4 lb

## 2024-01-14 DIAGNOSIS — Z1211 Encounter for screening for malignant neoplasm of colon: Secondary | ICD-10-CM | POA: Diagnosis not present

## 2024-01-14 DIAGNOSIS — Z0001 Encounter for general adult medical examination with abnormal findings: Secondary | ICD-10-CM | POA: Diagnosis not present

## 2024-01-14 DIAGNOSIS — Z833 Family history of diabetes mellitus: Secondary | ICD-10-CM | POA: Diagnosis not present

## 2024-01-15 ENCOUNTER — Other Ambulatory Visit: Payer: Self-pay | Admitting: Nurse Practitioner

## 2024-01-15 DIAGNOSIS — R946 Abnormal results of thyroid function studies: Secondary | ICD-10-CM

## 2024-01-15 LAB — HEPB+HEPC+HIV PANEL
HIV Screen 4th Generation wRfx: NONREACTIVE
Hep B C IgM: NEGATIVE
Hep B Core Total Ab: NEGATIVE
Hep B E Ab: NONREACTIVE
Hep B E Ag: NEGATIVE
Hep B Surface Ab, Qual: UNDETERMINED
Hep C Virus Ab: NONREACTIVE
Hepatitis B Surface Ag: NEGATIVE

## 2024-01-15 LAB — CBC WITH DIFFERENTIAL/PLATELET
Basophils Absolute: 0 10*3/uL (ref 0.0–0.2)
Basos: 0 %
EOS (ABSOLUTE): 0.1 10*3/uL (ref 0.0–0.4)
Eos: 1 %
Hematocrit: 39.6 % (ref 34.0–46.6)
Hemoglobin: 13.9 g/dL (ref 11.1–15.9)
Immature Grans (Abs): 0 10*3/uL (ref 0.0–0.1)
Immature Granulocytes: 0 %
Lymphocytes Absolute: 2 10*3/uL (ref 0.7–3.1)
Lymphs: 29 %
MCH: 33.3 pg — ABNORMAL HIGH (ref 26.6–33.0)
MCHC: 35.1 g/dL (ref 31.5–35.7)
MCV: 95 fL (ref 79–97)
Monocytes Absolute: 0.6 10*3/uL (ref 0.1–0.9)
Monocytes: 8 %
Neutrophils Absolute: 4.3 10*3/uL (ref 1.4–7.0)
Neutrophils: 62 %
Platelets: 170 10*3/uL (ref 150–450)
RBC: 4.17 x10E6/uL (ref 3.77–5.28)
RDW: 12.1 % (ref 11.7–15.4)
WBC: 7 10*3/uL (ref 3.4–10.8)

## 2024-01-15 LAB — THYROID PANEL
Free Thyroxine Index: 1.1 — ABNORMAL LOW (ref 1.2–4.9)
T3 Uptake Ratio: 22 % — ABNORMAL LOW (ref 24–39)
T4, Total: 5.1 ug/dL (ref 4.5–12.0)

## 2024-01-15 LAB — LIPID PANEL
Chol/HDL Ratio: 4.9 ratio — ABNORMAL HIGH (ref 0.0–4.4)
Cholesterol, Total: 216 mg/dL — ABNORMAL HIGH (ref 100–199)
HDL: 44 mg/dL (ref >39)
LDL Chol Calc (NIH): 116 mg/dL — ABNORMAL HIGH (ref 0–99)
Triglycerides: 321 mg/dL — ABNORMAL HIGH (ref 0–149)
VLDL Cholesterol Cal: 56 mg/dL — ABNORMAL HIGH (ref 5–40)

## 2024-01-15 LAB — COMPREHENSIVE METABOLIC PANEL WITH GFR
ALT: 36 IU/L — ABNORMAL HIGH (ref 0–32)
AST: 25 IU/L (ref 0–40)
Albumin: 4.6 g/dL (ref 3.9–4.9)
Alkaline Phosphatase: 94 IU/L (ref 44–121)
BUN/Creatinine Ratio: 13 (ref 9–23)
BUN: 12 mg/dL (ref 6–24)
Bilirubin Total: <0.2 mg/dL (ref 0.0–1.2)
CO2: 24 mmol/L (ref 20–29)
Calcium: 10.1 mg/dL (ref 8.7–10.2)
Chloride: 106 mmol/L (ref 96–106)
Creatinine, Ser: 0.93 mg/dL (ref 0.57–1.00)
Globulin, Total: 2.2 g/dL (ref 1.5–4.5)
Glucose: 109 mg/dL — ABNORMAL HIGH (ref 70–99)
Potassium: 4.1 mmol/L (ref 3.5–5.2)
Sodium: 144 mmol/L (ref 134–144)
Total Protein: 6.8 g/dL (ref 6.0–8.5)
eGFR: 75 mL/min/{1.73_m2} (ref >59)

## 2024-01-16 ENCOUNTER — Encounter: Payer: Self-pay | Admitting: *Deleted

## 2024-01-22 DIAGNOSIS — Z1211 Encounter for screening for malignant neoplasm of colon: Secondary | ICD-10-CM | POA: Diagnosis not present

## 2024-01-27 LAB — COLOGUARD: COLOGUARD: NEGATIVE

## 2024-01-31 ENCOUNTER — Telehealth: Payer: Self-pay

## 2024-01-31 NOTE — Telephone Encounter (Signed)
 Patient call and stated that she received a letter from DSS stating that her medicaid was changing. Patient is concerned she may lose coverage and is still in treatment. Per Katharyn Pall at [facility] DSS, Patient will be receiving Medicaid Expansion starting 04/15/2024, recertification of benefits will be due in 2026. Patient informed, verified understanding.

## 2024-02-13 ENCOUNTER — Other Ambulatory Visit

## 2024-02-13 DIAGNOSIS — R946 Abnormal results of thyroid function studies: Secondary | ICD-10-CM

## 2024-02-14 LAB — TSH: TSH: 3.32 u[IU]/mL (ref 0.450–4.500)

## 2024-02-26 ENCOUNTER — Ambulatory Visit: Admitting: Nurse Practitioner

## 2024-02-26 ENCOUNTER — Encounter: Payer: Self-pay | Admitting: Nurse Practitioner

## 2024-02-26 VITALS — BP 125/74 | HR 68 | Temp 97.0°F | Ht 64.0 in | Wt 140.2 lb

## 2024-02-26 DIAGNOSIS — Z0001 Encounter for general adult medical examination with abnormal findings: Secondary | ICD-10-CM | POA: Diagnosis not present

## 2024-02-26 DIAGNOSIS — E782 Mixed hyperlipidemia: Secondary | ICD-10-CM

## 2024-02-26 DIAGNOSIS — Z0000 Encounter for general adult medical examination without abnormal findings: Secondary | ICD-10-CM | POA: Insufficient documentation

## 2024-02-26 MED ORDER — FENOFIBRATE 48 MG PO TABS
48.0000 mg | ORAL_TABLET | Freq: Every day | ORAL | 1 refills | Status: DC
Start: 2024-02-26 — End: 2024-08-26

## 2024-02-26 NOTE — Progress Notes (Signed)
 Complete physical exam  Patient: Lindsay Peters   DOB: 08/22/1976   48 y.o. Female  MRN: [id number]  Subjective:     Chief Complaint  Patient presents with   Annual Exam    SHAWNDRIKA BRINSON is a 49 y.o. female who presents today for a complete physical exam. She reports consuming a general diet. The patient does not participate in regular exercise at present. She generally feels well. She reports sleeping well. She does not have additional problems to discuss today.   Health  Maintenance: Cologuard 01/22/2024, need T-dap Most recent fall risk assessment:    01/14/2024    2:30 PM  Fall Risk   Falls in the past year? 0  Number falls in past yr: 0  Injury with Fall? 0  Risk for fall due to : No Fall Risks  Follow up Falls evaluation completed     Most recent depression screenings:    01/14/2024    2:29 PM 05/02/2023    1:44 PM  PHQ 2/9 Scores  PHQ - 2 Score 0 0  PHQ- 9 Score 0     Vision:Within last year, Dental: No current dental problems, and STD: The patient denies history of sexually transmitted disease.  Patient Active Problem List   Diagnosis Date Noted   Encounter for annual physical examination excluding gynecological examination in a patient older than 17 years 02/26/2024   Mixed hyperlipidemia 02/26/2024   Screening for colon cancer 01/14/2024   Encounter for general adult medical examination with abnormal findings 01/14/2024   Family history of diabetes mellitus in mother 01/14/2024   Malignant neoplasm of upper-outer quadrant of left breast in female, estrogen receptor positive (HCC) 07/23/2023   Osteopenia after menopause 06/28/2023   Genetic testing 05/10/2023   Breast cancer of upper-outer quadrant of left female breast (HCC) 05/02/2023   Malignant neoplasm of left female breast (HCC) 05/02/2023   History reviewed. No pertinent past medical history. 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, grade 1 US  LT BREAST BX W LOC DEV 1ST LESION IMG BX SPEC US  GUIDE 04/17/2023 Alger Infield, MD [facility]   NO PAST SURGERIES     PARTIAL MASTECTOMY WITH AXILLARY SENTINEL LYMPH NODE BIOPSY Left 05/28/2023   Procedure: PARTIAL MASTECTOMY WITH AXILLARY SENTINEL LYMPH NODE BIOPSY AND WITH RADIOFREQUENCY TAG;  Surgeon: Alanda Allegra, MD;  Location: [facility];  Service: General;  Laterality: Left;   Social History   Tobacco Use   Smoking status: Never   Smokeless tobacco: Never  Vaping Use   Vaping status: Never Used  Substance Use Topics   Alcohol use: Never   Drug use: Never   Social History   Socioeconomic History   Marital status: Single    Spouse name: Not on file   Number of children: 0   Years of education: Not on file   Highest education level: High school graduate  Occupational History   Not on file  Tobacco Use   Smoking status: Never   Smokeless tobacco: Never  Vaping Use   Vaping status: Never Used  Substance and Sexual Activity   Alcohol use: Never   Drug use: Never   Sexual activity: Not Currently  Other Topics Concern   Not on file  Social History Narrative   Not on file   Social Drivers of Health   Financial Resource Strain: Low Risk  (01/14/2024)   Overall  Financial Resource Strain (CARDIA)    Difficulty of Paying Living Expenses: Not hard at all  Food Insecurity: No Food Insecurity (02/26/2024)   Hunger Vital Sign    Worried About Running Out of Food in the Last Year: Never true    Ran Out of Food in the Last Year: Never true  Transportation Needs: No Transportation Needs (02/26/2024)   PRAPARE - Administrator, Civil Service (Medical): No    Lack of Transportation (Non-Medical): No  Physical Activity: Inactive (01/14/2024)   Exercise Vital Sign    Days of Exercise per Week: 0 days    Minutes of Exercise per Session: 0 min  Stress: Not on file  Social  Connections: Not on file  Intimate Partner Violence: Not At Risk (02/26/2024)   Humiliation, Afraid, Rape, and Kick questionnaire    Fear of Current or Ex-Partner: No    Emotionally Abused: No    Physically Abused: No    Sexually Abused: No   Family Status  Relation Name Status   Mother  Alive   Father  Deceased   Mat Aunt  Deceased   MGM  Deceased  No partnership data on file   Family History  Problem Relation Age of Onset   Diverticulosis Mother    Lung cancer Father    Melanoma Maternal Aunt        died in 03/19/99   Diabetes Maternal Grandmother    Colon cancer Maternal Grandmother        died 03/19/07   Allergies  Allergen Reactions   Sulfa Antibiotics Rash      Patient Care Team: St Verma Gobble, Adell Hones, NP as PCP - General (Nurse Practitioner) Paulett Boros, MD as Medical Oncologist (Medical Oncology) Gerhard Knuckles, RN as Oncology Nurse Navigator (Medical Oncology)   Outpatient Medications Prior to Visit  Medication Sig   anastrozole  (ARIMIDEX ) 1 MG tablet Take 1 tablet (1 mg total) by mouth daily.   Ascorbic Acid (VITAMIN C) 1000 MG tablet Take 1,000 mg by mouth daily.   Calcium Carb-Cholecalciferol (CALCIUM + VITAMIN D3) 600-10 MG-MCG TABS Take 1 tablet by mouth daily.   Cyanocobalamin 2500 MCG TABS Take 2,500 mcg by mouth daily.   Multiple Vitamin (MULTIVITAMIN) capsule Take 1 capsule by mouth daily.   No facility-administered medications prior to visit.    Review of Systems  Constitutional:  Negative for chills and fever.  HENT:  Negative for congestion and sore throat.   Eyes:  Negative for pain.  Respiratory:  Negative for cough, shortness of breath and wheezing.   Cardiovascular:  Negative for chest pain and leg swelling.  Gastrointestinal:  Negative for blood in stool, constipation, melena, nausea and vomiting.  Musculoskeletal:  Negative for falls.  Skin:  Negative for itching and rash.  Neurological:  Negative for dizziness, weakness and  headaches.  Psychiatric/Behavioral:  Negative for depression. The patient does not have insomnia.    Negative unless indicated in HPI    Objective:     BP 125/74   Pulse 68   Temp (!) 97 F (36.1 C) (Temporal)   Ht 5\' 4"  (1.626 m)   Wt 140 lb 3.2 oz (63.6 kg)   SpO2 100%   BMI 24.07 kg/m  BP Readings from Last 3 Encounters:  02/26/24 125/74  01/14/24 117/73  10/01/23 126/83   Wt Readings from Last 3 Encounters:  02/26/24 140 lb 3.2 oz (63.6 kg)  01/14/24 141 lb 6.4 oz (64.1 kg)  10/01/23 143 lb  9.6 oz (65.1 kg)      Physical Exam Vitals and nursing note reviewed.  Constitutional:      General: She is not in acute distress.    Appearance: Normal appearance.  HENT:     Head: Normocephalic and atraumatic.     Right Ear: Tympanic membrane, ear canal and external ear normal. There is no impacted cerumen.     Left Ear: Tympanic membrane, ear canal and external ear normal. There is no impacted cerumen.     Nose: Nose normal.     Mouth/Throat:     Mouth: Mucous membranes are moist.  Eyes:     General: No scleral icterus.    Extraocular Movements: Extraocular movements intact.     Conjunctiva/sclera: Conjunctivae normal.     Pupils: Pupils are equal, round, and reactive to light.  Cardiovascular:     Heart sounds: Normal heart sounds.  Pulmonary:     Effort: Pulmonary effort is normal.     Breath sounds: Normal breath sounds.  Musculoskeletal:        General: Normal range of motion.     Right lower leg: No edema.     Left lower leg: No edema.  Skin:    General: Skin is warm and dry.     Findings: No rash.  Neurological:     Mental Status: She is alert and oriented to person, place, and time.  Psychiatric:        Mood and Affect: Mood normal.        Behavior: Behavior normal.        Thought Content: Thought content normal.        Judgment: Judgment normal.      No results found for any visits on 02/26/24. Last CBC Lab Results  Component Value Date   WBC  7.0 01/14/2024   HGB 13.9 01/14/2024   HCT 39.6 01/14/2024   MCV 95 01/14/2024   MCH 33.3 (H) 01/14/2024   RDW 12.1 01/14/2024   PLT 170 01/14/2024   Last metabolic panel Lab Results  Component Value Date   GLUCOSE 109 (H) 01/14/2024   NA 144 01/14/2024   K 4.1 01/14/2024   CL 106 01/14/2024   CO2 24 01/14/2024   BUN 12 01/14/2024   CREATININE 0.93 01/14/2024   EGFR 75 01/14/2024   CALCIUM 10.1 01/14/2024   PROT 6.8 01/14/2024   ALBUMIN 4.6 01/14/2024   LABGLOB 2.2 01/14/2024   BILITOT <0.2 01/14/2024   ALKPHOS 94 01/14/2024   AST 25 01/14/2024   ALT 36 (H) 01/14/2024   ANIONGAP 10 09/24/2023   Last lipids Lab Results  Component Value Date   CHOL 216 (H) 01/14/2024   HDL 44 01/14/2024   LDLCALC 116 (H) 01/14/2024   TRIG 321 (H) 01/14/2024   CHOLHDL 4.9 (H) 01/14/2024    Last thyroid  functions Lab Results  Component Value Date   TSH 3.320 02/13/2024   T4TOTAL 5.1 01/14/2024        Assessment & Plan:    Routine Health Maintenance and Physical Exam  Discussed health benefits of physical activity, and encouraged her to engage in regular exercise appropriate for her age and condition. Hyperlipidemia: star tricor and continue fish oil daily  Mixed hyperlipidemia -     Fenofibrate; Take 1 tablet (48 mg total) by mouth daily.  Dispense: 90 tablet; Refill: 1  Encounter for annual physical examination excluding gynecological examination in a patient older than 17 years    Return in about 6  months (around 08/28/2024) for Chronic diseases Management.     Jayton Popelka St [place] Thompson, DNP [facility] [address] [place], [place] [address] [phone]

## 2024-04-01 ENCOUNTER — Inpatient Hospital Stay: Payer: Medicaid Other | Attending: General Surgery

## 2024-04-01 ENCOUNTER — Ambulatory Visit (HOSPITAL_COMMUNITY)
Admission: RE | Admit: 2024-04-01 | Discharge: 2024-04-01 | Disposition: A | Discharge status: Home / Self Care | Payer: Medicaid Other | Source: Ambulatory Visit | Attending: Hematology | Admitting: Hematology

## 2024-04-01 DIAGNOSIS — Z9889 Other specified postprocedural states: Secondary | ICD-10-CM

## 2024-04-01 DIAGNOSIS — M8580 Other specified disorders of bone density and structure, unspecified site: Secondary | ICD-10-CM | POA: Diagnosis not present

## 2024-04-01 DIAGNOSIS — Z800 Family history of malignant neoplasm of digestive organs: Secondary | ICD-10-CM | POA: Diagnosis not present

## 2024-04-01 DIAGNOSIS — R92343 Mammographic extreme density, bilateral breasts: Secondary | ICD-10-CM | POA: Diagnosis not present

## 2024-04-01 DIAGNOSIS — Z170 Estrogen receptor positive status [ER+]: Secondary | ICD-10-CM | POA: Insufficient documentation

## 2024-04-01 DIAGNOSIS — Z923 Personal history of irradiation: Secondary | ICD-10-CM | POA: Diagnosis not present

## 2024-04-01 DIAGNOSIS — C50912 Malignant neoplasm of unspecified site of left female breast: Secondary | ICD-10-CM | POA: Diagnosis not present

## 2024-04-01 DIAGNOSIS — C50412 Malignant neoplasm of upper-outer quadrant of left female breast: Secondary | ICD-10-CM | POA: Insufficient documentation

## 2024-04-01 DIAGNOSIS — Z801 Family history of malignant neoplasm of trachea, bronchus and lung: Secondary | ICD-10-CM | POA: Diagnosis not present

## 2024-04-01 DIAGNOSIS — R7401 Elevation of levels of liver transaminase levels: Secondary | ICD-10-CM | POA: Insufficient documentation

## 2024-04-01 DIAGNOSIS — Z1721 Progesterone receptor positive status: Secondary | ICD-10-CM | POA: Insufficient documentation

## 2024-04-01 DIAGNOSIS — Z79811 Long term (current) use of aromatase inhibitors: Secondary | ICD-10-CM | POA: Insufficient documentation

## 2024-04-01 DIAGNOSIS — Z1732 Human epidermal growth factor receptor 2 negative status: Secondary | ICD-10-CM | POA: Diagnosis not present

## 2024-04-01 LAB — CBC WITH DIFFERENTIAL/PLATELET
Abs Immature Granulocytes: 0.02 10*3/uL (ref 0.00–0.07)
Basophils Absolute: 0 10*3/uL (ref 0.0–0.1)
Basophils Relative: 1 %
Eosinophils Absolute: 0.2 10*3/uL (ref 0.0–0.5)
Eosinophils Relative: 2 %
HCT: 38.1 % (ref 36.0–46.0)
Hemoglobin: 13.5 g/dL (ref 12.0–15.0)
Immature Granulocytes: 0 %
Lymphocytes Relative: 29 %
Lymphs Abs: 2.2 10*3/uL (ref 0.7–4.0)
MCH: 33.9 pg (ref 26.0–34.0)
MCHC: 35.4 g/dL (ref 30.0–36.0)
MCV: 95.7 fL (ref 80.0–100.0)
Monocytes Absolute: 0.7 10*3/uL (ref 0.1–1.0)
Monocytes Relative: 9 %
Neutro Abs: 4.5 10*3/uL (ref 1.7–7.7)
Neutrophils Relative %: 59 %
Platelets: 184 10*3/uL (ref 150–400)
RBC: 3.98 MIL/uL (ref 3.87–5.11)
RDW: 12.2 % (ref 11.5–15.5)
WBC: 7.6 10*3/uL (ref 4.0–10.5)
nRBC: 0 % (ref 0.0–0.2)

## 2024-04-01 LAB — COMPREHENSIVE METABOLIC PANEL WITH GFR
ALT: 101 U/L — ABNORMAL HIGH (ref 0–44)
AST: 53 U/L — ABNORMAL HIGH (ref 15–41)
Albumin: 4.2 g/dL (ref 3.5–5.0)
Alkaline Phosphatase: 81 U/L (ref 38–126)
Anion gap: 7 (ref 5–15)
BUN: 14 mg/dL (ref 6–20)
CO2: 29 mmol/L (ref 22–32)
Calcium: 9.8 mg/dL (ref 8.9–10.3)
Chloride: 105 mmol/L (ref 98–111)
Creatinine, Ser: 0.92 mg/dL (ref 0.44–1.00)
GFR, Estimated: >60 mL/min (ref >60)
Glucose, Bld: 103 mg/dL — ABNORMAL HIGH (ref 70–99)
Potassium: 4.5 mmol/L (ref 3.5–5.1)
Sodium: 141 mmol/L (ref 135–145)
Total Bilirubin: 0.6 mg/dL (ref 0.0–1.2)
Total Protein: 7.2 g/dL (ref 6.5–8.1)

## 2024-04-08 ENCOUNTER — Inpatient Hospital Stay (HOSPITAL_BASED_OUTPATIENT_CLINIC_OR_DEPARTMENT_OTHER): Payer: Medicaid Other | Admitting: Hematology

## 2024-04-08 VITALS — BP 109/72 | HR 66 | Temp 98.2°F | Resp 16

## 2024-04-08 DIAGNOSIS — Z170 Estrogen receptor positive status [ER+]: Secondary | ICD-10-CM | POA: Diagnosis not present

## 2024-04-08 DIAGNOSIS — C50412 Malignant neoplasm of upper-outer quadrant of left female breast: Secondary | ICD-10-CM | POA: Diagnosis not present

## 2024-04-08 DIAGNOSIS — E559 Vitamin D deficiency, unspecified: Secondary | ICD-10-CM | POA: Diagnosis not present

## 2024-04-08 DIAGNOSIS — Z1721 Progesterone receptor positive status: Secondary | ICD-10-CM | POA: Diagnosis not present

## 2024-04-08 DIAGNOSIS — Z800 Family history of malignant neoplasm of digestive organs: Secondary | ICD-10-CM | POA: Diagnosis not present

## 2024-04-08 DIAGNOSIS — Z79811 Long term (current) use of aromatase inhibitors: Secondary | ICD-10-CM | POA: Diagnosis not present

## 2024-04-08 DIAGNOSIS — M8580 Other specified disorders of bone density and structure, unspecified site: Secondary | ICD-10-CM | POA: Diagnosis not present

## 2024-04-08 DIAGNOSIS — Z1732 Human epidermal growth factor receptor 2 negative status: Secondary | ICD-10-CM | POA: Diagnosis not present

## 2024-04-08 DIAGNOSIS — Z801 Family history of malignant neoplasm of trachea, bronchus and lung: Secondary | ICD-10-CM | POA: Diagnosis not present

## 2024-04-08 DIAGNOSIS — Z923 Personal history of irradiation: Secondary | ICD-10-CM | POA: Diagnosis not present

## 2024-04-08 MED ORDER — ANASTROZOLE 1 MG PO TABS: 1.0000 mg | ORAL_TABLET | Freq: Every day | ORAL | 3 refills | Status: AC

## 2024-04-08 NOTE — Progress Notes (Signed)
 [facility] [address] [place] [address]    Clinic Day:  04/08/2024  Referring physician: No ref. provider found  Patient Care Team: Lindsay Peters, Nena, NP as PCP - General (Nurse Practitioner) Rogers Hai, MD as Medical Oncologist (Medical Oncology) Celestia Joesph SQUIBB, RN as Oncology Nurse Navigator (Medical Oncology)   ASSESSMENT & PLAN:   Assessment: 1.  Stage I (T1a N0 G1 ER/PR+, HER2-) left breast IDC: - Screening mammogram (03/30/2023): Possible asymmetry in the left breast. - Diagnostic mammogram (04/12/2023): Hypoechoic mass with margin irregularity in the left breast at 3:00 3 cm from nipple measuring 0.5 x 0.3 x 0.4 cm.  No lymphadenopathy in the axilla. 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  0/2 sentinel lymph nodes positive.  pT1b pN0. - Oncotype DX RS-15.  Distant recurrence risk at 9 years with AI/tamoxifen-4%.  Absolute chemotherapy benefit less than 1%. - Germline mutation testing: Negative. - Anastrozole  started on 06/28/2023.  XRT completed in November 2024.   2.  Social/family history: - Lives with boyfriend at home and is independent of ADLs and IADLs.  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  Non-smoker. - Father died of lung cancer. - Maternal grandmother had colon cancer.  Maternal aunt had melanoma.    Plan: 1.  Stage I (T1a N0 G1 ER/PR+, HER2-) left breast IDC: - She is tolerating anastrozole  very well.  Occasional hot flashes but denies any musculoskeletal symptoms. - Physical exam: Left breast lumpectomy scar in the lower outer quadrant stable.  Left nipple areolar complex has bluish/green discoloration from the dye.  No  palpable masses or adenopathy. - Mammogram on 04/01/2024: BI-RADS Category 2. - Reviewed labs from 04/01/2024: AST and ALT are elevated at 53 and 101.  CBC was normal. - She reports being started on fenofibrate  on 02/26/2024, which could be causing elevated LFTs.  She will reach out to her PMD. - Continue anastrozole .  RTC 6 months for follow-up.   2.  Osteopenia (DEXA scan 05/10/2023 T-score -1.9): - Continue calcium and vitamin D  supplements. - Plan to repeat DEXA scan in July 2026.    Orders Placed This Encounter  Procedures   CBC    Standing Status:   Future    Expected Date:   10/08/2024    Expiration Date:   01/06/2025   Comprehensive metabolic panel with GFR    Standing Status:   Future    Expected Date:   10/08/2024    Expiration Date:   01/06/2025    Release to patient:   Immediate   Vitamin D  25 hydroxy    Standing Status:   Future    Expected Date:   10/08/2024    Expiration Date:   01/06/2025      Lindsay Peters,acting as a scribe for Hai Rogers, MD.,have documented all relevant documentation on the behalf of Hai Rogers, MD,as directed by  Hai Rogers, MD while in the presence of Hai Rogers, MD.  I, Hai Rogers MD, have reviewed the above documentation for accuracy and completeness, and I agree with the above.    Hai Rogers, MD   6/24/20253:53 PM  CHIEF COMPLAINT:   Diagnosis: left breast cancer    Cancer Staging  Breast cancer of upper-outer quadrant of left female breast [facility]) Staging form: Breast, AJCC 8th Edition - Clinical stage from 05/02/2023: Stage IA (cT1b, cN0(sn), cM0, G1, ER+, PR+, HER2-) - Signed by Rogers Hai, MD on 06/28/2023    Prior Therapy: 1. Left lumpectomy, 05/28/23 2. XRT to left breast, 08/03/23 - 08/30/23  Current Therapy:  anastrozole    HISTORY OF PRESENT ILLNESS:   Oncology History  Breast cancer of upper-outer quadrant of left female breast (HCC)  05/02/2023 Initial  Diagnosis   Breast cancer of upper-outer quadrant of left female breast (HCC)   05/02/2023 Cancer Staging   Staging form: Breast, AJCC 8th Edition - Clinical stage from 05/02/2023: Stage IA (cT1b, cN0(sn), cM0, G1, ER+, PR+, HER2-) - Signed by Rogers Hai, MD on 06/28/2023 Histopathologic type: Infiltrating duct carcinoma, NOS Stage prefix: Initial diagnosis Method of lymph node assessment: Sentinel lymph node biopsy Nuclear grade: G1 Histologic grading system: 3 grade system      INTERVAL HISTORY:   Lindsay Peters is a 48 y.o. female presenting to clinic today for follow up of left breast cancer. She was last seen by me on 10/01/23.  Since her last visit, she underwent bilateral diagnostic mammogram on 04/01/24 that found: No evidence of RIGHT or LEFT breast malignancy.   Today, she states that she is doing well overall. Her appetite level is at 100%. Her energy level is at 70%.  Lindsay Peters reports getting easily tired that she believes is from radiation therapy. She is tolerating Anastrozole  well and notes occasional hot flashes. She denies any new onset aches or pains, including joint pains. [name] is taking calcium and Vitamin D  as prescribed. She would like a refill of anastrozole .   Lindsay Peters has started taking Tricor  one month ago and OTC fish oil pills after a visit with her PCP on 02/26/24. She does not have follow-up for several months.   PAST MEDICAL HISTORY:   Past Medical History: No past medical history on file.  Surgical History: 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, grade 1 US  LT BREAST BX W LOC DEV 1ST LESION IMG BX SPEC US  GUIDE 04/17/2023 Lennon Nest, MD [facility]   NO PAST SURGERIES     PARTIAL MASTECTOMY WITH AXILLARY SENTINEL LYMPH NODE BIOPSY Left 05/28/2023   Procedure: PARTIAL MASTECTOMY WITH AXILLARY SENTINEL LYMPH NODE BIOPSY AND  WITH RADIOFREQUENCY TAG;  Surgeon: Mavis Anes, MD;  Location: [facility];  Service: General;  Laterality: Left;    Social History: Social History   Socioeconomic History   Marital status: Single    Spouse name: Not on file   Number of children: 0   Years of education: Not on file   Highest education level: High school graduate  Occupational History   Not on file  Tobacco Use   Smoking status: Never   Smokeless tobacco: Never  Vaping Use   Vaping status: Never Used  Substance and Sexual Activity   Alcohol use: Never   Drug use: Never   Sexual activity: Not Currently  Other Topics Concern   Not on file  Social History Narrative   Not on file   Social Drivers of Health   Financial Resource Strain: Low Risk  (01/14/2024)   Overall Financial Resource Strain (CARDIA)    Difficulty of Paying Living Expenses: Not hard at all  Food Insecurity: No Food Insecurity (02/26/2024)   Hunger Vital Sign    Worried  About Running Out of Food in the Last Year: Never true    Ran Out of Food in the Last Year: Never true  Transportation Needs: No Transportation Needs (02/26/2024)   PRAPARE - Administrator, Civil Service (Medical): No    Lack of Transportation (Non-Medical): No  Physical Activity: Inactive (01/14/2024)   Exercise Vital Sign    Days of Exercise per Week: 0 days    Minutes of Exercise per Session: 0 min  Stress: Not on file  Social Connections: Not on file  Intimate Partner Violence: Not At Risk (02/26/2024)   Humiliation, Afraid, Rape, and Kick questionnaire    Fear of Current or Ex-Partner: No    Emotionally Abused: No    Physically Abused: No    Sexually Abused: No    Family History: Family History  Problem Relation Age of Onset   Diverticulosis Mother    Lung cancer Father    Melanoma Maternal Aunt        died in 1999/05/07   Diabetes Maternal Grandmother    Colon cancer Maternal Grandmother        died 05/07/2007    Current Medications:  Current Outpatient  Medications:    Ascorbic Acid (VITAMIN C) 1000 MG tablet, Take 1,000 mg by mouth daily., Disp: , Rfl:    Calcium Carb-Cholecalciferol (CALCIUM + VITAMIN D3) 600-10 MG-MCG TABS, Take 1 tablet by mouth daily., Disp: , Rfl:    Cyanocobalamin 2500 MCG TABS, Take 2,500 mcg by mouth daily., Disp: , Rfl:    fenofibrate  (TRICOR ) 48 MG tablet, Take 1 tablet (48 mg total) by mouth daily., Disp: 90 tablet, Rfl: 1   Multiple Vitamin (MULTIVITAMIN) capsule, Take 1 capsule by mouth daily., Disp: , Rfl:    anastrozole  (ARIMIDEX ) 1 MG tablet, Take 1 tablet (1 mg total) by mouth daily., Disp: 90 tablet, Rfl: 3   Allergies: Allergies  Allergen Reactions   Sulfa Antibiotics Rash    REVIEW OF SYSTEMS:   Review of Systems  Constitutional:  Negative for chills, fatigue and fever.  HENT:   Negative for lump/mass, mouth sores, nosebleeds, sore throat and trouble swallowing.   Eyes:  Negative for eye problems.  Respiratory:  Negative for cough and shortness of breath.   Cardiovascular:  Negative for chest pain, leg swelling and palpitations.  Gastrointestinal:  Negative for abdominal pain, constipation, diarrhea, nausea and vomiting.  Genitourinary:  Negative for bladder incontinence, difficulty urinating, dysuria, frequency, hematuria and nocturia.   Musculoskeletal:  Negative for arthralgias, back pain, flank pain, myalgias and neck pain.  Skin:  Negative for itching and rash.  Neurological:  Negative for dizziness, headaches and numbness.  Hematological:  Does not bruise/bleed easily.  Psychiatric/Behavioral:  Negative for depression, sleep disturbance and suicidal ideas. The patient is not nervous/anxious.   All other systems reviewed and are negative.    VITALS:   Blood pressure 109/72, pulse 66, temperature 98.2 F (36.8 C), temperature source Oral, resp. rate 16, SpO2 95%.  Wt Readings from Last 3 Encounters:  02/26/24 140 lb 3.2 oz (63.6 kg)  01/14/24 141 lb 6.4 oz (64.1 kg)  10/01/23 143 lb  9.6 oz (65.1 kg)    There is no height or weight on file to calculate BMI.  Performance status (ECOG): 1 - Symptomatic but completely ambulatory  PHYSICAL EXAM:   Physical Exam Vitals and nursing note reviewed. Exam conducted with a chaperone present.  Constitutional:      Appearance: Normal appearance.   Cardiovascular:  Rate and Rhythm: Normal rate and regular rhythm.     Pulses: Normal pulses.     Heart sounds: Normal heart sounds.  Pulmonary:     Effort: Pulmonary effort is normal.     Breath sounds: Normal breath sounds.  Chest:     Comments: +lower outer quadrant scar in the left breast +bluish discoloration of the areolar complex on the left lower side Abdominal:     Palpations: Abdomen is soft. There is no hepatomegaly, splenomegaly or mass.     Tenderness: There is no abdominal tenderness.   Musculoskeletal:     Right lower leg: No edema.     Left lower leg: No edema.  Lymphadenopathy:     Cervical: No cervical adenopathy.     Right cervical: No superficial, deep or posterior cervical adenopathy.    Left cervical: No superficial, deep or posterior cervical adenopathy.     Upper Body:     Right upper body: No supraclavicular or axillary adenopathy.     Left upper body: No supraclavicular or axillary adenopathy.   Neurological:     General: No focal deficit present.     Mental Status: She is alert and oriented to person, place, and time.   Psychiatric:        Mood and Affect: Mood normal.        Behavior: Behavior normal.   Breast Exam Chaperone: Farrel Bohr, LPN   LABS:      Latest Ref Rng & Units 04/01/2024   10:33 AM 01/14/2024    2:53 PM 09/24/2023   10:22 AM  CBC  WBC 4.0 - 10.5 K/uL 7.6  7.0  6.2   Hemoglobin 12.0 - 15.0 g/dL 86.4  86.0  86.2   Hematocrit 36.0 - 46.0 % 38.1  39.6  40.4   Platelets 150 - 400 K/uL 184  170  178       Latest Ref Rng & Units 04/01/2024   10:33 AM 01/14/2024    2:53 PM 09/24/2023   10:22 AM  CMP  Glucose 70  - 99 mg/dL 896  890  891   BUN 6 - 20 mg/dL 14  12  12    Creatinine 0.44 - 1.00 mg/dL 9.07  9.06  8.89   Sodium 135 - 145 mmol/L 141  144  142   Potassium 3.5 - 5.1 mmol/L 4.5  4.1  3.7   Chloride 98 - 111 mmol/L 105  106  104   CO2 22 - 32 mmol/L 29  24  28    Calcium 8.9 - 10.3 mg/dL 9.8  89.8  9.8   Total Protein 6.5 - 8.1 g/dL 7.2  6.8  7.5   Total Bilirubin 0.0 - 1.2 mg/dL 0.6  <9.7  0.5   Alkaline Phos 38 - 126 U/L 81  94  68   AST 15 - 41 U/L 53  25  29   ALT 0 - 44 U/L 101  36  40      No results found for: CEA1, CEA / No results found for: CEA1, CEA No results found for: PSA1 No results found for: CAN199 No results found for: CAN125  No results found for: TOTALPROTELP, ALBUMINELP, A1GS, A2GS, BETS, BETA2SER, GAMS, MSPIKE, SPEI No results found for: TIBC, FERRITIN, IRONPCTSAT No results found for: LDH   STUDIES:   MM DIAG BREAST TOMO BILATERAL Result Date: 04/01/2024 CLINICAL DATA:  48 year old woman with history of invasive ductal carcinoma status post LEFT breast lumpectomy on 05/28/2023 returns  for annual mammogram. EXAM: DIGITAL DIAGNOSTIC BILATERAL MAMMOGRAM WITH TOMOSYNTHESIS AND CAD TECHNIQUE: Bilateral digital diagnostic mammography and breast tomosynthesis was performed. The images were evaluated with computer-aided detection. COMPARISON:  Previous exam(s). ACR Breast Density Category d: The breasts are extremely dense, which lowers the sensitivity of mammography. FINDINGS: RIGHT: Mammogram: No suspicious mass, distortion, or microcalcifications are identified to suggest presence of malignancy. LEFT: Mammogram: No suspicious mass, distortion, or microcalcifications are identified to suggest presence of malignancy. Postsurgical changes are seen in the outer LEFT breast. IMPRESSION: No evidence of RIGHT or LEFT breast malignancy. RECOMMENDATION: BILATERAL diagnostic mammogram in 1 year. I have discussed the findings and recommendations  with the patient. If applicable, a reminder letter will be sent to the patient regarding the next appointment. BI-RADS CATEGORY  2: Benign. Electronically Signed   By: Aliene Lloyd M.D.   On: 04/01/2024 10:10

## 2024-04-08 NOTE — Patient Instructions (Addendum)
 [facility] - [facility]  Discharge Instructions  You were seen and examined today by Dr. Rogers.  Dr. Rogers discussed your most recent lab work which revealed that everything looks good and stable except your liver numbers are slightly elevated. The cholesterol pill can cause your liver numbers to be elevated. Please reach out to Dr. Sebastian about the liver test being elevated.  Dr. Rogers sent in a refill for your Anastrozole .  Follow-up as scheduled.    Thank you for choosing [facility] to provide your oncology and hematology care.   To afford each patient quality time with our provider, please arrive at least 15 minutes before your scheduled appointment time. You may need to reschedule your appointment if you arrive late (10 or more minutes). Arriving late affects you and other patients whose appointments are after yours.  Also, if you miss three or more appointments without notifying the office, you may be dismissed from the clinic at the provider's discretion.    Again, thank you for choosing [facility].  Our hope is that these requests will decrease the amount of time that you wait before being seen by our physicians.   If you have a lab appointment with the Cancer Center - please note that after April 8th, all labs will be drawn in the cancer center.  You do not have to check in or register with the main entrance as you have in the past but will complete your check-in at the cancer center.            _____________________________________________________________  Should you have questions after your visit to [facility], please contact our office at [phone] and follow the prompts.  Our office hours are 8:00 a.m. to 4:30 p.m. Monday - Thursday and 8:00 a.m. to 2:30 p.m. Friday.  Please note that voicemails left after 4:00 p.m. may not be returned until the following business day.  We are closed  weekends and all major holidays.  You do have access to a nurse 24-7, just call the main number to the clinic [phone] and do not press any options, hold on the line and a nurse will answer the phone.    For prescription refill requests, have your pharmacy contact our office and allow 72 hours.    Masks are no longer required in the cancer centers. If you would like for your care team to wear a mask while they are taking care of you, please let them know. You may have one support person who is at least 50 years old accompany you for your appointments.

## 2024-04-14 ENCOUNTER — Other Ambulatory Visit: Payer: Self-pay | Admitting: Nurse Practitioner

## 2024-04-14 ENCOUNTER — Telehealth: Payer: Self-pay | Admitting: Nurse Practitioner

## 2024-04-14 DIAGNOSIS — E782 Mixed hyperlipidemia: Secondary | ICD-10-CM

## 2024-04-14 MED ORDER — PRAVASTATIN SODIUM 10 MG PO TABS: 10.0000 mg | ORAL_TABLET | Freq: Every day | ORAL | 0 refills | Status: DC

## 2024-04-14 NOTE — Telephone Encounter (Signed)
Left message making pt aware and to call back if needed.

## 2024-04-14 NOTE — Telephone Encounter (Signed)
 Copied from CRM [id number]. Topic: General - Call Back - No Documentation >> Apr 14, 2024  2:19 PM Wess RAMAN wrote: Reason for CRM: Patient would like a callback from [facility]. Lindsay Peters, Lindsay Peters to speak about her fenofibrate  (TRICOR ) 48 MG tablet. She stated her liver levels are elevated.  Callback #: [phone]

## 2024-04-14 NOTE — Telephone Encounter (Signed)
 Pt seen oncology. Labs were drawn and oncology would like to PCP to change Tricor  to different med since pts LFT's are elevated.

## 2024-05-08 ENCOUNTER — Other Ambulatory Visit: Payer: Self-pay | Admitting: Nurse Practitioner

## 2024-05-08 DIAGNOSIS — E782 Mixed hyperlipidemia: Secondary | ICD-10-CM

## 2024-06-27 ENCOUNTER — Encounter: Payer: Self-pay | Admitting: *Deleted

## 2024-08-21 NOTE — Progress Notes (Signed)
 Subjective:  Patient ID: Lindsay Peters, female    DOB: December 26, 1975, 48 y.o.   MRN: [id number]  Patient Care Team: Deitra Morton Sebastian Nena, NP as PCP - General (Nurse Practitioner) Celestia Joesph SQUIBB, RN as Oncology Nurse Navigator (Medical Oncology)   Chief Complaint:  Medical Management of Chronic Issues (6 month)   HPI: Lindsay Peters is a 48 y.o. female presenting on 08/26/2024 for Medical Management of Chronic Issues (6 month)   Discussed the use of AI scribe software for clinical note transcription with the patient, who gave verbal consent to proceed.  History of Present Illness Lindsay Peters is a 48 year old female with a history of breast cancer in remission who presents for a six-month follow-up for chronic disease management.  She is currently in remission from breast cancer, initially diagnosed with a 0.5 cm lesion in the left breast, which was surgically removed. She is on anastrozole  (Arimidex ) for ongoing treatment. A follow-up mammogram is scheduled for December.  She is managing mixed hyperlipidemia with pravastatin  10 mg daily and Tricor  48 mg. Additionally, she takes supplements including vitamin B, vitamin C, calcium, vitamin D , fish oil, and a daily multivitamin.  No fatigue except when engaging in activities like cleaning the house. Regular bowel movements twice every morning without issues.  She received her flu shot in early October at [place] [facility]. She does not smoke, drink, or use drugs.  Lab 04/01/2024 AST and LFT and elevated  Relevant past medical, surgical, family, and social history reviewed and updated as indicated.  Allergies and medications reviewed and updated. Data reviewed: Chart in Epic.   History reviewed. No pertinent past medical history.  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, grade 1 US  LT BREAST BX W LOC  DEV 1ST LESION IMG BX SPEC US  GUIDE 04/17/2023 Lennon Nest, MD [facility]   NO PAST SURGERIES     PARTIAL MASTECTOMY WITH AXILLARY SENTINEL LYMPH NODE BIOPSY Left 05/28/2023   Procedure: PARTIAL MASTECTOMY WITH AXILLARY SENTINEL LYMPH NODE BIOPSY AND WITH RADIOFREQUENCY TAG;  Surgeon: Mavis Anes, MD;  Location: [facility];  Service: General;  Laterality: Left;    Social History   Socioeconomic History   Marital status: Single    Spouse name: Not on file   Number of children: 0   Years of education: Not on file   Highest education level: High school graduate  Occupational History   Not on file  Tobacco Use   Smoking status: Never   Smokeless tobacco: Never  Vaping Use   Vaping status: Never Used  Substance and Sexual Activity   Alcohol use: Never   Drug use: Never   Sexual activity: Not Currently  Other Topics Concern   Not on file  Social History Narrative   Not on file   Social Drivers of Health   Financial Resource Strain: Low Risk  (01/14/2024)   Overall Financial Resource Strain (CARDIA)    Difficulty of Paying Living Expenses: Not hard at all  Food Insecurity: No Food Insecurity (02/26/2024)   Hunger Vital Sign    Worried About Running Out of Food in the Last Year: Never true    Ran Out of Food in the Last Year: Never true  Transportation Needs: No Transportation Needs (02/26/2024)   PRAPARE - Administrator, Civil Service (Medical): No    Lack of Transportation (Non-Medical): No  Physical Activity: Inactive (01/14/2024)   Exercise Vital Sign    Days of Exercise per Week: 0 days    Minutes of Exercise per Session: 0 min  Stress: Not on file  Social Connections: Not on file  Intimate Partner Violence: Not At Risk (02/26/2024)   Humiliation, Afraid, Rape, and Kick questionnaire    Fear of Current or Ex-Partner: No    Emotionally Abused: No    Physically Abused: No    Sexually Abused: No    Outpatient Encounter Medications as of 08/26/2024   Medication Sig   anastrozole  (ARIMIDEX ) 1 MG tablet Take 1 tablet (1 mg total) by mouth daily.   Ascorbic Acid (VITAMIN C) 1000 MG tablet Take 1,000 mg by mouth daily.   Calcium Carb-Cholecalciferol (CALCIUM + VITAMIN D3) 600-10 MG-MCG TABS Take 1 tablet by mouth daily.   Cyanocobalamin 2500 MCG TABS Take 2,500 mcg by mouth daily.   Multiple Vitamin (MULTIVITAMIN) capsule Take 1 capsule by mouth daily.   [DISCONTINUED] fenofibrate  (TRICOR ) 48 MG tablet Take 1 tablet (48 mg total) by mouth daily.   [DISCONTINUED] pravastatin  (PRAVACHOL ) 10 MG tablet Take 1 tablet by mouth once daily   pravastatin  (PRAVACHOL ) 10 MG tablet Take 1 tablet (10 mg total) by mouth daily.   No facility-administered encounter medications on file as of 08/26/2024.    Allergies  Allergen Reactions   Sulfa Antibiotics Rash    Pertinent ROS per HPI, otherwise unremarkable      Objective:  BP 115/78   Pulse 71   Temp (!) 97.3 F (36.3 C) (Temporal)   Ht 5' 4 (1.626 m)   Wt 135 lb 6.4 oz (61.4 kg)   SpO2 95%   BMI 23.24 kg/m    Wt Readings from Last 3 Encounters:  08/26/24 135 lb 6.4 oz (61.4 kg)  02/26/24 140 lb 3.2 oz (63.6 kg)  01/14/24 141 lb 6.4 oz (64.1 kg)   BP Readings from Last 3 Encounters:  08/26/24 115/78  04/08/24 109/72  02/26/24 125/74     Physical Exam Vitals and nursing note reviewed.  Constitutional:      General: She is not in acute distress.    Appearance: Normal appearance.  HENT:     Head: Normocephalic and atraumatic.     Nose: Nose normal.     Mouth/Throat:     Mouth: Mucous membranes are moist.  Eyes:     General: No scleral icterus.    Extraocular Movements: Extraocular movements intact.     Conjunctiva/sclera: Conjunctivae normal.     Pupils: Pupils are equal, round, and reactive to light.  Cardiovascular:     Heart sounds: Normal heart sounds.  Pulmonary:     Effort: Pulmonary effort is normal.     Breath sounds: Normal breath sounds.  Abdominal:      General: Bowel sounds are normal.     Palpations: Abdomen is soft.  Musculoskeletal:        General: Normal range of motion.     Right lower leg: No edema.     Left lower leg: No edema.  Skin:    General: Skin is warm and dry.     Findings: No rash.  Neurological:     Mental Status: She is alert and oriented to person, place, and time.  Psychiatric:        Mood and Affect: Mood normal.        Thought Content: Thought content normal.        Judgment: Judgment normal.  Physical Exam      Results for orders placed or performed in visit on 04/01/24  Comprehensive metabolic panel   Collection Time: 04/01/24 10:33 AM  Result Value Ref Range   Sodium 141 135 - 145 mmol/L   Potassium 4.5 3.5 - 5.1 mmol/L   Chloride 105 98 - 111 mmol/L   CO2 29 22 - 32 mmol/L   Glucose, Bld 103 (H) 70 - 99 mg/dL   BUN 14 6 - 20 mg/dL   Creatinine, Ser 9.07 0.44 - 1.00 mg/dL   Calcium 9.8 8.9 - 89.6 mg/dL   Total Protein 7.2 6.5 - 8.1 g/dL   Albumin 4.2 3.5 - 5.0 g/dL   AST 53 (H) 15 - 41 U/L   ALT 101 (H) 0 - 44 U/L   Alkaline Phosphatase 81 38 - 126 U/L   Total Bilirubin 0.6 0.0 - 1.2 mg/dL   GFR, Estimated >39 >39 mL/min   Anion gap 7 5 - 15  CBC with Differential   Collection Time: 04/01/24 10:33 AM  Result Value Ref Range   WBC 7.6 4.0 - 10.5 K/uL   RBC 3.98 3.87 - 5.11 MIL/uL   Hemoglobin 13.5 12.0 - 15.0 g/dL   HCT 61.8 63.9 - 53.9 %   MCV 95.7 80.0 - 100.0 fL   MCH 33.9 26.0 - 34.0 pg   MCHC 35.4 30.0 - 36.0 g/dL   RDW 87.7 88.4 - 84.4 %   Platelets 184 150 - 400 K/uL   nRBC 0.0 0.0 - 0.2 %   Neutrophils Relative % 59 %   Neutro Abs 4.5 1.7 - 7.7 K/uL   Lymphocytes Relative 29 %   Lymphs Abs 2.2 0.7 - 4.0 K/uL   Monocytes Relative 9 %   Monocytes Absolute 0.7 0.1 - 1.0 K/uL   Eosinophils Relative 2 %   Eosinophils Absolute 0.2 0.0 - 0.5 K/uL   Basophils Relative 1 %   Basophils Absolute 0.0 0.0 - 0.1 K/uL   Immature Granulocytes 0 %   Abs Immature Granulocytes 0.02  0.00 - 0.07 K/uL       Pertinent labs & imaging results that were available during my care of the patient were reviewed by me and considered in my medical decision making.  Assessment & Plan:  Lindsay Peters was seen today for medical management of chronic issues.  Diagnoses and all orders for this visit:  Mixed hyperlipidemia -     pravastatin  (PRAVACHOL ) 10 MG tablet; Take 1 tablet (10 mg total) by mouth daily.  Abnormal LFTs -     Hepatic Function Panel  Malignant neoplasm of upper-outer quadrant of left breast in female, estrogen receptor positive (HCC)     Assessment and Plan Lindsay Peters is a 48 year old Caucasian female seen today for chronic disease management, no acute distress Assessment & Plan Chronic disease management follow-up Routine follow-up for chronic disease management. No new symptoms reported. - Continue current medications: pravastatin  10 mg daily, vitamin B deficiency supplement, vitamin C, calcium, vitamin D , and fish oil. - Ordered blood work to check vitamin D  level, liver function, and kidney function.  Malignant neoplasm of upper-outer quadrant of left breast, in remission, on anastrozole  Breast cancer in remission, currently on anastrozole . Upcoming follow-up mammogram scheduled for December. - Continue anastrozole  1 mg oral daily. - Ensure follow-up appointment for mammogram in December.  Mixed hyperlipidemia Managed with pravastatin . - Continue pravastatin  10 mg daily and Tricor  48 mg.  Other specified abnormal findings of blood chemistry Routine blood chemistry  monitoring required. - Ordered blood work to check liver function and kidney function.  Elevated liver enzyme Hepatic function ordered  General Health Maintenance Routine health maintenance discussed. She received flu shot in October. No Pap smear needed at this time. - Ensure follow-up for Pap smear if due in one year. - Continue monthly self-breast exams.      Continue all other  maintenance medications.  Follow up plan: Return in about 1 year (around 08/26/2025) for physical with labs.   Continue healthy lifestyle choices, including diet (rich in fruits, vegetables, and lean proteins, and low in salt and simple carbohydrates) and exercise (at least 30 minutes of moderate physical activity daily).  Educational handout given for    Clinical References  Breast Cancer, Female  Breast cancer is a malignant growth of tissue (tumor) in the breast. Unlike noncancerous (benign) tumors, malignant tumors are cancerous and can spread to other parts of the body. The two most common types of breast cancer start in the milk ducts (ductal carcinoma) or in the lobules where milk is made in the breast (lobular carcinoma). Breast cancer is one of the most common types of cancer in women. What are the causes? The exact cause of female breast cancer is unknown. What increases the risk? The following factors may make you more likely to develop this condition: Being older than 50 years of age. Having a family history of breast cancer. Starting menopause after age 42. Starting your menstrual periods before age 51. Having never been pregnant or having your first child after age 68. Having never breastfed. A personal history of: Breast cancer. Dense breast tissue. Radiation exposure. Having the BRCA1 and BRCA2 genes. Having certain types of benign breast conditions. Exposure to the drug DES, which was given to pregnant women from the 1940s to the 1970s. Other risks include: Using birth control pills. Using hormone therapy after menopause. Drinking more than one alcoholic drink a day. Obesity. What are the signs or symptoms? Symptoms of this condition include: A painless lump or thickening in your breast. Changes in the size or shape of your breast. Breast skin changes, such as puckering or dimpling. Nipple abnormalities, such as scaling, crustiness, redness, or pulling in  (retraction). Nipple discharge that is bloody or clear. How is this diagnosed? This condition may be diagnosed by: Taking your medical history and doing a physical exam. During the exam, your health care provider will feel the tissue around your breast and under your arms. Taking a sample of nipple discharge. The sample will be examined under a microscope. Performing imaging tests, such as breast X-rays (mammogram), ultrasound, or MRI. Taking a tissue sample (biopsy) from the breast. The sample will be examined under a microscope to look for cancer cells. Taking a sample from the lymph nodes near the affected breast (sentinel node biopsy). Your cancer will be staged to determine its severity and extent. Staging is a careful attempt to find out the size of the tumor, whether the cancer has spread, and if so, to what parts of the body. Staging also includes testing your tumor for certain receptors, such as estrogen, progesterone, and human epidermal growth factor receptor 2 (HER2). This will help your cancer care team decide on a treatment that will work best for you. You may need to have more tests to determine the stage of your cancer. Stages include the following: Stage 0--The tumor has not spread to other breast tissue. Stage 1 (I)--The cancer is only found in the breast or  may be in the lymph nodes. The tumor may be up to  inch (2 cm) wide. Stage 2 (II)--The cancer has spread to nearby lymph nodes. The tumor may be up to 2 inches (5 cm) wide. Stage 3 (III)--The cancer has spread to more distant lymph nodes. The tumor may be larger than 2 inches (5 cm) wide. Stage 4 (IV)--The cancer has spread to other parts of the body, such as the bones, brain, liver, or lungs. How is this treated? Treatment for this condition depends on the type and stage of the breast cancer. It may be treated with: Surgery. This may involve breast-conserving surgery (lumpectomy or partial mastectomy) in which only the part  of the breast containing the cancer is removed. Some normal tissue surrounding this area may also be removed. In some cases, surgery may be done to remove the entire breast (mastectomy) and nipple. Lymph nodes may also be removed. Radiation therapy, which uses high-energy rays to kill cancer cells. Chemotherapy, which is the use of medicines to kill cancer cells. Hormone therapy, which involves taking medicine to adjust the hormone levels in your body. You may take medicine to decrease your estrogen levels. This can help stop cancer cells from growing. Targeted therapy, in which medicines are used to block the growth and spread of cancer cells. These medicines target a specific part of the cancer cell and usually cause fewer side effects than chemotherapy. Targeted therapy may be used alone or in combination with chemotherapy. Immunotherapy, which is the use of medicines to boost the immune system to recognize and destroy cancer cells more effectively. A combination of surgery, radiation, chemotherapy, or hormone therapy may be needed to treat breast cancer. Follow these instructions at home: Take over-the-counter and prescription medicines only as told by your health care provider. Eat a healthy diet. A healthy diet includes lots of fruits and vegetables, low-fat dairy products, lean meats, and fiber. Make sure half your plate is filled with fruits or vegetables. Choose high-fiber foods such as whole-grain breads and cereals. Consider joining a support group. This may help you cope with the stress of having breast cancer. Talk to your health care team about exercise and physical activity. The right exercise program can: Help prevent or reduce symptoms such as fatigue or depression. Improve overall health and survival rates. Keep all follow-up visits. This is important. Where to find more information American Cancer Society: www.cancer.org National Cancer Institute: www.cancer.gov Contact a  health care provider if: You have a sudden increase in pain. You have any symptoms or changes that concern you. You lose weight without trying. You notice a new lump in either breast or under your arm. You develop swelling in either arm or hand. You have a fever. You notice new fatigue or weakness. Get help right away if: You have chest pain or trouble breathing. These symptoms may be an emergency. Get help right away. Call 911. Do not wait to see if the symptoms will go away. Do not drive yourself to the hospital. Summary Breast cancer is a malignant growth of tissue (tumor) in the breast. Your cancer will be staged to determine its severity and extent. Treatment for this condition depends on the type and stage of the breast cancer. This information is not intended to replace advice given to you by your health care provider. Make sure you discuss any questions you have with your health care provider. Document Revised: 08/22/2021 Document Reviewed: 08/22/2021 Elsevier Patient Education  2024 Else[employer] Inc. Dyslipidemia Dyslipidemia  is an imbalance of waxy, fat-like substances (lipids) in the blood. The body needs lipids in small amounts. Dyslipidemia often involves a high level of cholesterol or triglycerides, which are types of lipids. Common forms of dyslipidemia include: High levels of LDL cholesterol. LDL is the type of cholesterol that causes fatty deposits (plaques) to build up in the blood vessels that carry blood away from the heart (arteries). Low levels of HDL cholesterol. HDL cholesterol is the type of cholesterol that protects against heart disease. High levels of HDL remove the LDL buildup from arteries. High levels of triglycerides. Triglycerides are a fatty substance in the blood that is linked to a buildup of plaques in the arteries. What are the causes? There are two main types of dyslipidemia: primary and secondary. Primary dyslipidemia is caused by changes (mutations) in  genes that are passed down through families (inherited). These mutations cause several types of dyslipidemia. Secondary dyslipidemia may be caused by various risk factors that can lead to the disease, such as lifestyle choices and certain medical conditions. What increases the risk? You are more likely to develop this condition if you are an older man or if you are a woman who has gone through menopause. Other risk factors include: Having a family history of dyslipidemia. Taking certain medicines, including birth control pills, steroids, some diuretics, and beta-blockers. Eating a diet high in saturated fat. Smoking cigarettes or excessive alcohol intake. Having certain medical conditions such as diabetes, polycystic ovary syndrome (PCOS), kidney disease, liver disease, or hypothyroidism. Not exercising regularly. Being overweight or obese with too much belly fat. What are the signs or symptoms? In most cases, dyslipidemia does not usually cause any symptoms. In severe cases, very high lipid levels can cause: Fatty bumps under the skin (xanthomas). A white or gray ring around the black center (pupil) of the eye. Very high triglyceride levels can cause inflammation of the pancreas (pancreatitis). How is this diagnosed? Your health care provider may diagnose dyslipidemia based on a routine blood test (fasting blood test). Because most people do not have symptoms of the condition, this blood testing (lipid profile) is done on adults age 60 and older and is repeated every 4-6 years. This test checks: Total cholesterol. This measures the total amount of cholesterol in your blood, including LDL cholesterol, HDL cholesterol, and triglycerides. A healthy number is below 200 mg/dL (4.82 mmol/L). LDL cholesterol. The target number for LDL cholesterol is different for each person, depending on individual risk factors. A healthy number is usually below 100 mg/dL (7.40 mmol/L). Ask your health care provider  what your LDL cholesterol should be. HDL cholesterol. An HDL level of 60 mg/dL (8.44 mmol/L) or higher is best because it helps to protect against heart disease. A number below 40 mg/dL (8.96 mmol/L) for men or below 50 mg/dL (8.70 mmol/L) for women increases the risk for heart disease. Triglycerides. A healthy triglyceride number is below 150 mg/dL (8.30 mmol/L). If your lipid profile is abnormal, your health care provider may do other blood tests. How is this treated? Treatment depends on the type of dyslipidemia that you have and your other risk factors for heart disease and stroke. Your health care provider will have a target range for your lipid levels based on this information. Treatment for dyslipidemia starts with lifestyle changes, such as diet and exercise. Your health care provider may recommend that you: Get regular exercise. Make changes to your diet. Quit smoking if you smoke. Limit your alcohol intake. If diet changes  and exercise do not help you reach your goals, your health care provider may also prescribe medicine to lower lipids. The most commonly prescribed type of medicine lowers your LDL cholesterol (statin drug). If you have a high triglyceride level, your provider may prescribe another type of drug (fibrate) or an omega-3 fish oil supplement, or both. Follow these instructions at home: Eating and drinking  Follow instructions from your health care provider or dietitian about eating or drinking restrictions. Eat a healthy diet as told by your health care provider. This can help you reach and maintain a healthy weight, lower your LDL cholesterol, and raise your HDL cholesterol. This may include: Limiting your calories, if you are overweight. Eating more fruits, vegetables, whole grains, fish, and lean meats. Limiting saturated fat, trans fat, and cholesterol. Do not drink alcohol if: Your health care provider tells you not to drink. You are pregnant, may be pregnant, or  are planning to become pregnant. If you drink alcohol: Limit how much you have to: 0-1 drink a day for women. 0-2 drinks a day for men. Know how much alcohol is in your drink. In the [place] one drink equals one 12 oz bottle of beer (355 mL), one 5 oz glass of wine (148 mL), or one 1 oz glass of hard liquor (44 mL). Activity Get regular exercise. Start an exercise and strength training program as told by your health care provider. Ask your health care provider what activities are safe for you. Your health care provider may recommend: 30 minutes of aerobic activity 4-6 days a week. Brisk walking is an example of aerobic activity. Strength training 2 days a week. General instructions Do not use any products that contain nicotine or tobacco. These products include cigarettes, chewing tobacco, and vaping devices, such as e-cigarettes. If you need help quitting, ask your health care provider. Take over-the-counter and prescription medicines only as told by your health care provider. This includes supplements. Keep all follow-up visits. This is important. Contact a health care provider if: You are having trouble sticking to your exercise or diet plan. You are struggling to quit smoking or to control your use of alcohol. Summary Dyslipidemia often involves a high level of cholesterol or triglycerides, which are types of lipids. Treatment depends on the type of dyslipidemia that you have and your other risk factors for heart disease and stroke. Treatment for dyslipidemia starts with lifestyle changes, such as diet and exercise. Your health care provider may prescribe medicine to lower lipids. This information is not intended to replace advice given to you by your health care provider. Make sure you discuss any questions you have with your health care provider. Document Revised: 05/05/2022 Document Reviewed: 12/06/2020 Elsevier Patient Education  [employer].  The above assessment and  management plan was discussed with the patient. The patient verbalized understanding of and has agreed to the management plan. Patient is aware to call the clinic if they develop any new symptoms or if symptoms persist or worsen. Patient is aware when to return to the clinic for a follow-up visit. Patient educated on when it is appropriate to go to the emergency department.   Alic Hilburn [place] Thompson, DNP [facility] [address] [place], [place] [address] [phone]

## 2024-08-26 ENCOUNTER — Encounter: Payer: Self-pay | Admitting: Nurse Practitioner

## 2024-08-26 ENCOUNTER — Ambulatory Visit: Payer: Self-pay | Admitting: Nurse Practitioner

## 2024-08-26 VITALS — BP 115/78 | HR 71 | Temp 97.3°F | Ht 64.0 in | Wt 135.4 lb

## 2024-08-26 DIAGNOSIS — C50412 Malignant neoplasm of upper-outer quadrant of left female breast: Secondary | ICD-10-CM

## 2024-08-26 DIAGNOSIS — Z170 Estrogen receptor positive status [ER+]: Secondary | ICD-10-CM

## 2024-08-26 DIAGNOSIS — E782 Mixed hyperlipidemia: Secondary | ICD-10-CM

## 2024-08-26 DIAGNOSIS — R7989 Other specified abnormal findings of blood chemistry: Secondary | ICD-10-CM

## 2024-08-26 MED ORDER — PRAVASTATIN SODIUM 10 MG PO TABS: 10.0000 mg | ORAL_TABLET | Freq: Every day | ORAL | 3 refills | Status: AC

## 2024-08-27 LAB — HEPATIC FUNCTION PANEL
ALT: 26 IU/L (ref 0–32)
AST: 21 IU/L (ref 0–40)
Albumin: 4.6 g/dL (ref 3.9–4.9)
Alkaline Phosphatase: 91 IU/L (ref 41–116)
Bilirubin Total: 0.3 mg/dL (ref 0.0–1.2)
Bilirubin, Direct: 0.1 mg/dL (ref 0.00–0.40)
Total Protein: 6.8 g/dL (ref 6.0–8.5)

## 2024-08-28 ENCOUNTER — Ambulatory Visit: Admitting: Nurse Practitioner

## 2024-09-01 ENCOUNTER — Ambulatory Visit: Payer: Self-pay | Admitting: Nurse Practitioner

## 2024-09-24 DIAGNOSIS — Z170 Estrogen receptor positive status [ER+]: Secondary | ICD-10-CM | POA: Diagnosis not present

## 2024-09-24 DIAGNOSIS — C50912 Malignant neoplasm of unspecified site of left female breast: Secondary | ICD-10-CM | POA: Diagnosis not present

## 2024-09-30 ENCOUNTER — Other Ambulatory Visit (HOSPITAL_COMMUNITY): Payer: Self-pay | Admitting: Physician Assistant

## 2024-09-30 ENCOUNTER — Encounter (HOSPITAL_COMMUNITY): Payer: Self-pay | Admitting: Physician Assistant

## 2024-09-30 ENCOUNTER — Inpatient Hospital Stay: Attending: Oncology

## 2024-09-30 DIAGNOSIS — Z801 Family history of malignant neoplasm of trachea, bronchus and lung: Secondary | ICD-10-CM | POA: Diagnosis not present

## 2024-09-30 DIAGNOSIS — Z923 Personal history of irradiation: Secondary | ICD-10-CM | POA: Diagnosis not present

## 2024-09-30 DIAGNOSIS — R232 Flushing: Secondary | ICD-10-CM | POA: Diagnosis not present

## 2024-09-30 DIAGNOSIS — E559 Vitamin D deficiency, unspecified: Secondary | ICD-10-CM | POA: Insufficient documentation

## 2024-09-30 DIAGNOSIS — Z1732 Human epidermal growth factor receptor 2 negative status: Secondary | ICD-10-CM | POA: Insufficient documentation

## 2024-09-30 DIAGNOSIS — Z79811 Long term (current) use of aromatase inhibitors: Secondary | ICD-10-CM | POA: Insufficient documentation

## 2024-09-30 DIAGNOSIS — Z9889 Other specified postprocedural states: Secondary | ICD-10-CM

## 2024-09-30 DIAGNOSIS — C50412 Malignant neoplasm of upper-outer quadrant of left female breast: Secondary | ICD-10-CM | POA: Diagnosis present

## 2024-09-30 DIAGNOSIS — Z170 Estrogen receptor positive status [ER+]: Secondary | ICD-10-CM | POA: Diagnosis not present

## 2024-09-30 DIAGNOSIS — Z1721 Progesterone receptor positive status: Secondary | ICD-10-CM | POA: Diagnosis not present

## 2024-09-30 LAB — CBC
HCT: 40.4 % (ref 36.0–46.0)
Hemoglobin: 13.9 g/dL (ref 12.0–15.0)
MCH: 32.6 pg (ref 26.0–34.0)
MCHC: 34.4 g/dL (ref 30.0–36.0)
MCV: 94.8 fL (ref 80.0–100.0)
Platelets: 183 K/uL (ref 150–400)
RBC: 4.26 MIL/uL (ref 3.87–5.11)
RDW: 12.5 % (ref 11.5–15.5)
WBC: 7.4 K/uL (ref 4.0–10.5)
nRBC: 0 % (ref 0.0–0.2)

## 2024-09-30 LAB — COMPREHENSIVE METABOLIC PANEL WITH GFR
ALT: 27 U/L (ref 0–44)
AST: 25 U/L (ref 15–41)
Albumin: 4.8 g/dL (ref 3.5–5.0)
Alkaline Phosphatase: 93 U/L (ref 38–126)
Anion gap: 13 (ref 5–15)
BUN: 16 mg/dL (ref 6–20)
CO2: 26 mmol/L (ref 22–32)
Calcium: 10 mg/dL (ref 8.9–10.3)
Chloride: 105 mmol/L (ref 98–111)
Creatinine, Ser: 0.93 mg/dL (ref 0.44–1.00)
GFR, Estimated: >60 mL/min (ref >60)
Glucose, Bld: 103 mg/dL — ABNORMAL HIGH (ref 70–99)
Potassium: 4.1 mmol/L (ref 3.5–5.1)
Sodium: 143 mmol/L (ref 135–145)
Total Bilirubin: 0.4 mg/dL (ref 0.0–1.2)
Total Protein: 7.6 g/dL (ref 6.5–8.1)

## 2024-09-30 LAB — VITAMIN D 25 HYDROXY (VIT D DEFICIENCY, FRACTURES): Vit D, 25-Hydroxy: 70.9 ng/mL (ref 30–100)

## 2024-10-01 ENCOUNTER — Inpatient Hospital Stay: Admitting: Oncology

## 2024-10-01 VITALS — HR 64 | Temp 97.1°F | Resp 18 | Ht 63.58 in | Wt 136.0 lb

## 2024-10-01 DIAGNOSIS — C50412 Malignant neoplasm of upper-outer quadrant of left female breast: Secondary | ICD-10-CM | POA: Diagnosis not present

## 2024-10-01 DIAGNOSIS — E559 Vitamin D deficiency, unspecified: Secondary | ICD-10-CM

## 2024-10-01 DIAGNOSIS — Z170 Estrogen receptor positive status [ER+]: Secondary | ICD-10-CM

## 2024-10-01 NOTE — Assessment & Plan Note (Addendum)
°-   She is tolerating anastrozole  very well.  Occasional hot flashes but denies any musculoskeletal symptoms. - Physical exam: Left breast lumpectomy scar in the lower outer quadrant stable.  Left nipple areolar complex has bluish/green discoloration from the dye.  No palpable masses or adenopathy. - Mammogram on 04/01/2024: BI-RADS Category 2.  Repeat mammogram in June 2026. - Reviewed labs from 04/01/2024: CMP unremarkable.  CBC was normal. - Continue anastrozole .  RTC 6 months for follow-up.

## 2024-10-01 NOTE — Progress Notes (Signed)
 [facility] OFFICE PROGRESS NOTE  [facility] Hummer, Nena, NP  ASSESSMENT & PLAN:    Assessment & Plan Malignant neoplasm of upper-outer quadrant of left breast in female, estrogen receptor positive (HCC)  - She is tolerating anastrozole  very well.  Occasional hot flashes but denies any musculoskeletal symptoms. - Physical exam: Left breast lumpectomy scar in the lower outer quadrant stable.  Left nipple areolar complex has bluish/green discoloration from the dye.  No palpable masses or adenopathy. - Mammogram on 04/01/2024: BI-RADS Category 2.  Repeat mammogram in June 2026. - Reviewed labs from 04/01/2024: CMP unremarkable.  CBC was normal. - Continue anastrozole .  RTC 6 months for follow-up.  Vitamin D  deficiency -Vitamin D  level greater than 70. -Recommend reducing vitamin D  to every other day. -Bone density every 2 years.  Orders Placed This Encounter  Procedures   US  LIMITED ULTRASOUND INCLUDING AXILLA RIGHT BREAST    Standing Status:   Future    Expected Date:   04/01/2025    Expiration Date:   06/30/2025    Reason for Exam (SYMPTOM  OR DIAGNOSIS REQUIRED):   lumpectomy left    Preferred imaging location?:   [facility]   US  LIMITED ULTRASOUND INCLUDING AXILLA LEFT BREAST     Standing Status:   Future    Expected Date:   04/01/2025    Expiration Date:   06/30/2025    Reason for Exam (SYMPTOM  OR DIAGNOSIS REQUIRED):   lumpectomy    Preferred imaging location?:   [facility]   MM 3D DIAGNOSTIC MAMMOGRAM BILATERAL BREAST    Standing Status:   Future    Expected Date:   04/01/2025    Expiration Date:   06/30/2025    Reason for Exam (SYMPTOM  OR DIAGNOSIS REQUIRED):   lumpectomy left    Is the patient pregnant?:   No    Preferred imaging location?:   [facility]   CBC    Standing Status:   Future    Expected Date:   04/01/2025    Expiration Date:   06/30/2025   Comprehensive metabolic panel with GFR    Standing Status:   Future     Expected Date:   04/01/2025    Expiration Date:   06/30/2025    Release to patient:   Immediate   Vitamin D  25 hydroxy    Standing Status:   Future    Expected Date:   04/01/2025    Expiration Date:   06/30/2025    INTERVAL HISTORY: Patient returns for follow-up for breast cancer.  She is currently tolerating anastrozole  well.  Denies any side effects with this medication.  Denies any hospitalizations, surgeries or changes to her baseline health.  Patient had a mammogram in June 2025 that was BI-RADS Category 2.  She is currently taking vitamin D  every day.  We reviewed CBC, CMP, vitamin D .  SUMMARY OF HEMATOLOGIC HISTORY: Oncology History  Breast cancer of upper-outer quadrant of left female breast (HCC)  05/02/2023 Initial Diagnosis   Breast cancer of upper-outer quadrant of left female breast (HCC)   05/02/2023 Cancer Staging   Staging form: Breast, AJCC 8th Edition - Clinical stage from 05/02/2023: Stage IA (cT1b, cN0(sn), cM0, G1, ER+, PR+, HER2-) - Signed by Rogers Hai, MD on 06/28/2023 Histopathologic type: Infiltrating duct carcinoma, NOS Stage prefix: Initial diagnosis Method of lymph node assessment: Sentinel lymph node biopsy Nuclear grade: G1 Histologic grading system: 3 grade system    1.  Stage I (T1a N0 G1 ER/PR+, HER2-) left breast IDC: - Screening mammogram (03/30/2023): Possible asymmetry in the left breast. - Diagnostic mammogram (04/12/2023): Hypoechoic mass with margin irregularity in the left breast at 3:00 3 cm from nipple measuring 0.5 x 0.3 x 0.4 cm.  No lymphadenopathy in the axilla. 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  0/2 sentinel lymph nodes positive.  pT1b pN0. -  Oncotype DX RS-15.  Distant recurrence risk at 9 years with AI/tamoxifen-4%.  Absolute chemotherapy benefit less than 1%. - Germline mutation testing: Negative. - Anastrozole  started on 06/28/2023.  XRT completed in November 2024.   2.  Social/family history: - Lives with boyfriend at home and is independent of ADLs and IADLs.  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  Non-smoker. - Father died of lung cancer. - Maternal grandmother had colon cancer.  Maternal aunt had melanoma.  CBC    Component Value Date/Time   WBC 7.4 09/30/2024 1046   RBC 4.26 09/30/2024 1046   HGB 13.9 09/30/2024 1046   HGB 13.9 01/14/2024 1453   HCT 40.4 09/30/2024 1046   HCT 39.6 01/14/2024 1453   PLT 183 09/30/2024 1046   PLT 170 01/14/2024 1453   MCV 94.8 09/30/2024 1046   MCV 95 01/14/2024 1453   MCH 32.6 09/30/2024 1046   MCHC 34.4 09/30/2024 1046   RDW 12.5 09/30/2024 1046   RDW 12.1 01/14/2024 1453   LYMPHSABS 2.2 04/01/2024 1033   LYMPHSABS 2.0 01/14/2024 1453   MONOABS 0.7 04/01/2024 1033   EOSABS 0.2 04/01/2024 1033   EOSABS 0.1 01/14/2024 1453   BASOSABS 0.0 04/01/2024 1033   BASOSABS 0.0 01/14/2024 1453       Latest Ref Rng & Units 09/30/2024   10:46 AM 08/26/2024   10:01 AM 04/01/2024   10:33 AM  CMP  Glucose 70 - 99 mg/dL 896   896   BUN 6 - 20 mg/dL 16   14   Creatinine 9.55 - 1.00 mg/dL 9.06   9.07   Sodium 864 - 145 mmol/L 143   141   Potassium 3.5 - 5.1 mmol/L 4.1   4.5   Chloride 98 - 111 mmol/L 105   105   CO2 22 - 32 mmol/L 26   29   Calcium 8.9 - 10.3 mg/dL 89.9   9.8   Total Protein 6.5 - 8.1 g/dL 7.6  6.8  7.2   Total Bilirubin 0.0 - 1.2 mg/dL 0.4  0.3  0.6   Alkaline Phos 38 - 126 U/L 93  91  81   AST 15 - 41 U/L 25  21  53   ALT 0 - 44 U/L 27  26  101      No results found for: FERRITIN, VITAMINB12  Vitals:   10/01/24 0956  Pulse: 64  Resp: 18  Temp: (!) 97.1 F (36.2 C)  SpO2: 100%    Review of System:  Review of Systems   Constitutional:  Negative for malaise/fatigue.    Physical Exam: Physical Exam Constitutional:      Appearance: Normal appearance.  HENT:     Head: Normocephalic and atraumatic.  Eyes:     Pupils: Pupils are equal, round, and reactive to light.  Cardiovascular:  Rate and Rhythm: Normal rate and regular rhythm.     Heart sounds: Normal heart sounds. No murmur heard. Pulmonary:     Effort: Pulmonary effort is normal.     Breath sounds: Normal breath sounds. No wheezing.  Chest:  Breasts:    Right: Normal.     Left: Normal.     Comments: Lumpectomy scar line within normal limits.  Left axillary lymph node scar WNL. Abdominal:     General: Bowel sounds are normal. There is no distension.     Palpations: Abdomen is soft.     Tenderness: There is no abdominal tenderness.  Musculoskeletal:        General: Normal range of motion.     Cervical back: Normal range of motion.  Lymphadenopathy:     Upper Body:     Right upper body: No axillary adenopathy.     Left upper body: No axillary adenopathy.  Skin:    General: Skin is warm and dry.     Findings: No rash.  Neurological:     Mental Status: She is alert and oriented to person, place, and time.     Gait: Gait is intact.  Psychiatric:        Mood and Affect: Mood and affect normal.        Cognition and Memory: Memory normal.        Judgment: Judgment normal.      I spent 25 minutes dedicated to the care of this patient (face-to-face and non-face-to-face) on the date of the encounter to include what is described in the assessment and plan.,  Delon Hope, NP 10/01/2024 10:29 AM

## 2024-10-07 ENCOUNTER — Inpatient Hospital Stay: Admitting: Physician Assistant

## 2024-10-13 ENCOUNTER — Encounter: Payer: Self-pay | Admitting: *Deleted

## 2024-10-30 ENCOUNTER — Encounter: Payer: Self-pay | Admitting: *Deleted

## 2025-04-07 ENCOUNTER — Other Ambulatory Visit (HOSPITAL_COMMUNITY)

## 2025-04-07 ENCOUNTER — Inpatient Hospital Stay

## 2025-04-07 ENCOUNTER — Encounter (HOSPITAL_COMMUNITY)

## 2025-04-14 ENCOUNTER — Inpatient Hospital Stay: Admitting: Oncology

## 2025-08-27 ENCOUNTER — Encounter: Admitting: Family Medicine

## 2025-08-27 ENCOUNTER — Encounter: Admitting: Nurse Practitioner
